# Patient Record
Sex: Female | Born: 1970 | Race: Black or African American | Hispanic: No | Smoking: Never smoker
Health system: Southern US, Community
[De-identification: ages and names within clinical notes are randomized; demographics above are authoritative.]

## PROBLEM LIST (undated history)

## (undated) DIAGNOSIS — E669 Obesity, unspecified: Secondary | ICD-10-CM

## (undated) DIAGNOSIS — G459 Transient cerebral ischemic attack, unspecified: Secondary | ICD-10-CM

## (undated) DIAGNOSIS — E039 Hypothyroidism, unspecified: Secondary | ICD-10-CM

## (undated) DIAGNOSIS — D649 Anemia, unspecified: Secondary | ICD-10-CM

## (undated) DIAGNOSIS — R7303 Prediabetes: Secondary | ICD-10-CM

## (undated) DIAGNOSIS — T148XXA Other injury of unspecified body region, initial encounter: Secondary | ICD-10-CM

## (undated) DIAGNOSIS — M1990 Unspecified osteoarthritis, unspecified site: Secondary | ICD-10-CM

## (undated) DIAGNOSIS — J45909 Unspecified asthma, uncomplicated: Secondary | ICD-10-CM

## (undated) DIAGNOSIS — G4733 Obstructive sleep apnea (adult) (pediatric): Secondary | ICD-10-CM

## (undated) DIAGNOSIS — Z9989 Dependence on other enabling machines and devices: Secondary | ICD-10-CM

## (undated) DIAGNOSIS — J189 Pneumonia, unspecified organism: Secondary | ICD-10-CM

## (undated) DIAGNOSIS — G43909 Migraine, unspecified, not intractable, without status migrainosus: Secondary | ICD-10-CM

## (undated) DIAGNOSIS — J42 Unspecified chronic bronchitis: Secondary | ICD-10-CM

## (undated) DIAGNOSIS — N3281 Overactive bladder: Secondary | ICD-10-CM

## (undated) HISTORY — DX: Hypothyroidism, unspecified: E03.9

---

## 2008-11-05 DEATH — deceased

## 2010-10-04 ENCOUNTER — Ambulatory Visit
Admission: RE | Admit: 2010-10-04 | Discharge: 2010-10-04 | Disposition: A | Discharge status: Home / Self Care | Payer: Medicaid Other | Source: Ambulatory Visit | Attending: Cardiovascular Disease | Admitting: Cardiovascular Disease

## 2010-10-04 ENCOUNTER — Other Ambulatory Visit: Payer: Self-pay | Admitting: Cardiovascular Disease

## 2010-10-04 DIAGNOSIS — W19XXXA Unspecified fall, initial encounter: Secondary | ICD-10-CM

## 2010-10-11 ENCOUNTER — Emergency Department (HOSPITAL_COMMUNITY): Payer: Medicaid Other

## 2010-10-11 ENCOUNTER — Other Ambulatory Visit (HOSPITAL_COMMUNITY): Payer: Medicaid Other

## 2010-10-11 ENCOUNTER — Emergency Department (HOSPITAL_COMMUNITY)
Admission: EM | Admit: 2010-10-11 | Discharge: 2010-10-11 | Disposition: A | Discharge status: Home / Self Care | Payer: Medicaid Other | Attending: Emergency Medicine | Admitting: Emergency Medicine

## 2010-10-11 DIAGNOSIS — I369 Nonrheumatic tricuspid valve disorder, unspecified: Secondary | ICD-10-CM

## 2010-10-11 DIAGNOSIS — R209 Unspecified disturbances of skin sensation: Secondary | ICD-10-CM | POA: Insufficient documentation

## 2010-10-11 DIAGNOSIS — R51 Headache: Secondary | ICD-10-CM | POA: Insufficient documentation

## 2010-10-11 DIAGNOSIS — R531 Weakness: Secondary | ICD-10-CM

## 2010-10-11 DIAGNOSIS — Z79899 Other long term (current) drug therapy: Secondary | ICD-10-CM | POA: Insufficient documentation

## 2010-10-11 DIAGNOSIS — I499 Cardiac arrhythmia, unspecified: Secondary | ICD-10-CM | POA: Insufficient documentation

## 2010-10-11 DIAGNOSIS — R200 Anesthesia of skin: Secondary | ICD-10-CM

## 2010-10-11 DIAGNOSIS — R079 Chest pain, unspecified: Secondary | ICD-10-CM | POA: Insufficient documentation

## 2010-10-11 LAB — POCT I-STAT, CHEM 8
Calcium, Ion: 1.21 mmol/L (ref 1.12–1.32)
Chloride: 102 mEq/L (ref 96–112)
Glucose, Bld: 132 mg/dL — ABNORMAL HIGH (ref 70–99)
HCT: 33 % — ABNORMAL LOW (ref 36.0–46.0)
TCO2: 28 mmol/L (ref 0–100)

## 2010-10-11 LAB — RAPID URINE DRUG SCREEN, HOSP PERFORMED
Amphetamines: NOT DETECTED
Benzodiazepines: NOT DETECTED
Cocaine: NOT DETECTED
Opiates: NOT DETECTED
Tetrahydrocannabinol: NOT DETECTED

## 2010-10-11 LAB — POCT PREGNANCY, URINE: Preg Test, Ur: NEGATIVE

## 2010-10-11 LAB — URINALYSIS, ROUTINE W REFLEX MICROSCOPIC
Bilirubin Urine: NEGATIVE
Ketones, ur: NEGATIVE mg/dL
Nitrite: NEGATIVE
Urobilinogen, UA: 0.2 mg/dL (ref 0.0–1.0)

## 2010-10-11 LAB — CBC
HCT: 33.6 % — ABNORMAL LOW (ref 36.0–46.0)
Hemoglobin: 10.7 g/dL — ABNORMAL LOW (ref 12.0–15.0)
WBC: 11.1 10*3/uL — ABNORMAL HIGH (ref 4.0–10.5)

## 2010-10-11 LAB — POCT CARDIAC MARKERS: Troponin i, poc: <0.05 ng/mL (ref 0.00–0.09)

## 2010-10-11 LAB — URINE MICROSCOPIC-ADD ON

## 2010-10-13 ENCOUNTER — Ambulatory Visit (HOSPITAL_COMMUNITY): Admission: RE | Admit: 2010-10-13 | Payer: Medicaid Other | Source: Ambulatory Visit

## 2010-10-13 ENCOUNTER — Ambulatory Visit: Admission: RE | Admit: 2010-10-13 | Payer: Medicaid Other | Source: Ambulatory Visit

## 2010-10-13 ENCOUNTER — Ambulatory Visit: Payer: Medicaid Other

## 2010-10-13 ENCOUNTER — Other Ambulatory Visit (HOSPITAL_COMMUNITY): Payer: Medicaid Other

## 2010-10-13 ENCOUNTER — Ambulatory Visit (HOSPITAL_COMMUNITY): Payer: Medicaid Other

## 2010-11-07 LAB — HM PAP SMEAR

## 2010-11-09 ENCOUNTER — Other Ambulatory Visit: Payer: Self-pay | Admitting: Obstetrics & Gynecology

## 2010-11-09 DIAGNOSIS — Z1231 Encounter for screening mammogram for malignant neoplasm of breast: Secondary | ICD-10-CM

## 2010-11-13 LAB — HM MAMMOGRAPHY

## 2010-11-15 ENCOUNTER — Ambulatory Visit (HOSPITAL_COMMUNITY)
Admission: RE | Admit: 2010-11-15 | Discharge: 2010-11-15 | Disposition: A | Discharge status: Home / Self Care | Payer: Medicaid Other | Source: Ambulatory Visit | Attending: Obstetrics & Gynecology | Admitting: Obstetrics & Gynecology

## 2010-11-15 DIAGNOSIS — Z1231 Encounter for screening mammogram for malignant neoplasm of breast: Secondary | ICD-10-CM

## 2010-11-22 ENCOUNTER — Encounter: Payer: Self-pay | Admitting: Endocrinology

## 2010-11-22 ENCOUNTER — Ambulatory Visit (INDEPENDENT_AMBULATORY_CARE_PROVIDER_SITE_OTHER): Payer: Medicaid Other | Admitting: Endocrinology

## 2010-11-22 DIAGNOSIS — R7309 Other abnormal glucose: Secondary | ICD-10-CM

## 2010-11-22 DIAGNOSIS — R739 Hyperglycemia, unspecified: Secondary | ICD-10-CM

## 2010-11-22 MED ORDER — METFORMIN HCL ER 500 MG PO TB24: 500.0000 mg | ORAL_TABLET | Freq: Every day | ORAL | Status: DC

## 2010-11-22 NOTE — Progress Notes (Signed)
Subjective:    Patient ID: Peggy Avery, female    DOB: 17-Aug-1970, 40 y.o.   MRN: [id number]  HPI Pt was noted to have hyperglycemia on labs.  She is G1 P1.  She says she is considering another pregnancy soon, and that it is possible now.  However, she would prefer to wait another 6 mos or so.  Symptomatically, she has many years of moderate pelvic cramps during menstruation, but no assoc decreased menstrual frequency.   Past Medical History  Diagnosis Date  . Hypothyroidism     No past surgical history on file.  History   Social History  . Marital Status: Single    Spouse Name: N/A    Number of Children: N/A  . Years of Education: N/A   Occupational History  . Not on file.   Social History Main Topics  . Smoking status: Never Smoker   . Smokeless tobacco: Not on file  . Alcohol Use: No  . Drug Use: Yes  . Sexually Active: Not on file   Other Topics Concern  . Not on file   Social History Narrative  . 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    No current outpatient prescriptions on file prior to visit.    Allergies  Allergen Reactions  . Ibuprofen   . Penicillins     Family History  Problem Relation Age of Onset  . Cancer Mother     Ovarian Cancer  . Cancer Father     Prostate Cancer  . Heart disease Other     Grandparent  . Stroke Other   . Hypertension Other   . Diabetes Other     Parent    BP 114/70  Pulse 96  Temp(Src) 98.6 F (37 C) (Oral)  Ht 5\' 4"  (1.626 m)  Wt 329 lb (149.233 kg)  BMI 56.47 kg/m2  SpO2 99%  LMP 10/10/2010     Review of Systems denies blurry vision, headache, chest pain, sob, n/v, urinary frequency, cramps, excessive diaphoresis, memory loss, depression, hypoglycemia, and rhinorrhea, and easy bruising.  She has lost 30 lbs, due to her efforts.      Objective:   Physical Exam VS: see vs page GEN: no distress.  Morbid obesity.   HEAD: head: no deformity eyes: no periorbital swelling, no proptosis external  nose and ears are normal mouth: no lesion seen Face:  Mild hirsutism NECK: supple, thyroid is not enlarged CHEST WALL: no deformity CV: reg rate and rhythm, no murmur ABD: abdomen is soft, nontender.  no hepatosplenomegaly.  not distended.  no hernia MUSCULOSKELETAL: muscle bulk and strength are grossly normal.  no obvious joint swelling.  gait is normal and steady EXTEMITIES: no deformity.  no ulcer on the feet.  feet are of normal color and temp.  There is trace bilat leg edema PULSES: dorsalis pedis intact bilat.  no carotid bruit. NEURO:  cn 2-12 grossly intact.   readily moves all 4's.  sensation is intact to touch on the feet, except decreased on the right foot (pt states old injury) SKIN:  Normal texture and temperature.  No rash or suspicious lesion is visible.   NODES:  None palpable at the neck PSYCH: alert, oriented x3.  Does not appear anxious nor depressed.     A1c=5.7   Assessment & Plan:  Hyperglycemia.  She has an approx 10% annual risk of developing dm. Morbid obesity.  She is at risk for other health probs. Dysmenorrhea.  The metformin may  make these better or worse.

## 2010-11-22 NOTE — Patient Instructions (Addendum)
good diet and exercise habits significanly improve the control of your blood sugar.  please let me know if you wish to be referred to a dietician, or for weight-loss surgery.  high blood sugar is very risky to your health.   i have sent a prescription to your pharmacy. Please note that this medication will increase your chances of getting pregnant.   Please make a follow-up appointment in 3 months.

## 2010-11-24 DIAGNOSIS — R739 Hyperglycemia, unspecified: Secondary | ICD-10-CM | POA: Insufficient documentation

## 2010-11-26 ENCOUNTER — Telehealth: Payer: Self-pay

## 2010-11-26 NOTE — Telephone Encounter (Signed)
Message copied by Margaret Pyle on Mon Nov 26, 2010  2:30 PM ------      Message from: Romero Belling      Created: Sat Nov 24, 2010  5:40 PM       Please fax note to pcp.  Thank you.

## 2010-11-26 NOTE — Telephone Encounter (Signed)
done

## 2010-11-30 ENCOUNTER — Encounter: Payer: Self-pay | Admitting: Endocrinology

## 2011-02-28 ENCOUNTER — Ambulatory Visit: Payer: Medicaid Other | Admitting: Endocrinology

## 2011-03-21 ENCOUNTER — Ambulatory Visit: Payer: Medicaid Other | Admitting: Endocrinology

## 2011-03-21 DIAGNOSIS — Z0289 Encounter for other administrative examinations: Secondary | ICD-10-CM

## 2011-04-04 ENCOUNTER — Other Ambulatory Visit (HOSPITAL_COMMUNITY): Payer: Self-pay | Admitting: Cardiovascular Disease

## 2011-04-11 ENCOUNTER — Encounter (HOSPITAL_COMMUNITY)
Admission: RE | Admit: 2011-04-11 | Discharge: 2011-04-11 | Disposition: A | Discharge status: Home / Self Care | Payer: Medicaid Other | Source: Ambulatory Visit | Attending: Cardiovascular Disease | Admitting: Cardiovascular Disease

## 2011-04-11 ENCOUNTER — Ambulatory Visit (HOSPITAL_COMMUNITY): Payer: Medicaid Other

## 2011-04-11 DIAGNOSIS — R079 Chest pain, unspecified: Secondary | ICD-10-CM | POA: Insufficient documentation

## 2011-04-12 ENCOUNTER — Ambulatory Visit (HOSPITAL_COMMUNITY)
Admission: RE | Admit: 2011-04-12 | Discharge: 2011-04-12 | Disposition: A | Discharge status: Home / Self Care | Payer: Medicaid Other | Source: Ambulatory Visit | Attending: Cardiovascular Disease | Admitting: Cardiovascular Disease

## 2011-04-12 DIAGNOSIS — R002 Palpitations: Secondary | ICD-10-CM | POA: Insufficient documentation

## 2011-04-12 DIAGNOSIS — E669 Obesity, unspecified: Secondary | ICD-10-CM | POA: Insufficient documentation

## 2011-04-12 MED ORDER — TECHNETIUM TC 99M TETROFOSMIN IV KIT
30.0000 | PACK | Freq: Once | INTRAVENOUS | Status: AC | PRN
  Administered 2011-04-12: 30 via INTRAVENOUS

## 2011-04-12 MED ORDER — TECHNETIUM TC 99M TETROFOSMIN IV KIT
30.0000 | PACK | Freq: Once | INTRAVENOUS | Status: AC | PRN
  Administered 2011-04-11: 30 via INTRAVENOUS

## 2011-05-09 ENCOUNTER — Ambulatory Visit
Admission: RE | Admit: 2011-05-09 | Discharge: 2011-05-09 | Disposition: A | Discharge status: Home / Self Care | Payer: Medicaid Other | Source: Ambulatory Visit | Attending: Cardiovascular Disease | Admitting: Cardiovascular Disease

## 2011-05-09 ENCOUNTER — Other Ambulatory Visit: Payer: Self-pay | Admitting: Cardiovascular Disease

## 2011-05-09 DIAGNOSIS — M79672 Pain in left foot: Secondary | ICD-10-CM

## 2011-05-09 DIAGNOSIS — M549 Dorsalgia, unspecified: Secondary | ICD-10-CM

## 2011-09-04 ENCOUNTER — Ambulatory Visit: Payer: Medicaid Other | Admitting: Physical Therapy

## 2011-09-28 ENCOUNTER — Emergency Department (HOSPITAL_COMMUNITY)
Admission: EM | Admit: 2011-09-28 | Discharge: 2011-09-28 | Disposition: A | Discharge status: Home / Self Care | Payer: Medicaid Other | Attending: Emergency Medicine | Admitting: Emergency Medicine

## 2011-09-28 ENCOUNTER — Emergency Department (HOSPITAL_COMMUNITY): Payer: Medicaid Other

## 2011-09-28 ENCOUNTER — Encounter (HOSPITAL_COMMUNITY): Payer: Self-pay | Admitting: Emergency Medicine

## 2011-09-28 ENCOUNTER — Other Ambulatory Visit: Payer: Self-pay

## 2011-09-28 DIAGNOSIS — R079 Chest pain, unspecified: Secondary | ICD-10-CM | POA: Insufficient documentation

## 2011-09-28 HISTORY — DX: Overactive bladder: N32.81

## 2011-09-28 HISTORY — DX: Obesity, unspecified: E66.9

## 2011-09-28 LAB — CBC
HCT: 34.1 % — ABNORMAL LOW (ref 36.0–46.0)
MCH: 28.8 pg (ref 26.0–34.0)
MCHC: 33.7 g/dL (ref 30.0–36.0)
RDW: 13.5 % (ref 11.5–15.5)

## 2011-09-28 LAB — BASIC METABOLIC PANEL
Calcium: 9.3 mg/dL (ref 8.4–10.5)
Chloride: 100 mEq/L (ref 96–112)
Creatinine, Ser: 0.85 mg/dL (ref 0.50–1.10)
GFR calc Af Amer: >90 mL/min (ref >90)
GFR calc non Af Amer: 83 mL/min — ABNORMAL LOW (ref >90)

## 2011-09-28 LAB — DIFFERENTIAL
Basophils Absolute: 0 10*3/uL (ref 0.0–0.1)
Basophils Relative: 0 % (ref 0–1)
Eosinophils Absolute: 0.1 10*3/uL (ref 0.0–0.7)
Eosinophils Relative: 1 % (ref 0–5)
Monocytes Absolute: 0.5 10*3/uL (ref 0.1–1.0)
Neutro Abs: 6.6 10*3/uL (ref 1.7–7.7)

## 2011-09-28 LAB — POCT I-STAT TROPONIN I: Troponin i, poc: 0 ng/mL (ref 0.00–0.08)

## 2011-09-28 MED ORDER — NITROGLYCERIN 0.4 MG SL SUBL
0.4000 mg | SUBLINGUAL_TABLET | SUBLINGUAL | Status: DC | PRN
  Administered 2011-09-28: 0.4 mg via SUBLINGUAL
  Filled 2011-09-28: qty 25

## 2011-09-28 MED ORDER — ASPIRIN 81 MG PO CHEW
324.0000 mg | CHEWABLE_TABLET | Freq: Once | ORAL | Status: AC
  Administered 2011-09-28: 324 mg via ORAL
  Filled 2011-09-28: qty 4

## 2011-09-28 NOTE — ED Notes (Signed)
PT. REPORTS LEFT CHEST PAIN AND LEFT ARM NUMBNESS ONSET THIS EVENING , DENIES SOB , NAUSEA OR DIAPHORESIS.

## 2011-09-28 NOTE — Discharge Instructions (Signed)
Your caregiver has diagnosed you as having chest pain that is nonspecific for one problem. This means that after looking at you and examining you and ordering tests (such as blood work, chest x-rays and EKG), your caregiver does not believe that the problem is serious enough to need watching in the hospital. This judgment is often made after testing shows no acute heart attack and you are at low risk for sudden acute heart condition. Chest pain comes from many different causes.  Seek immediate medical attention if:   You have severe chest pain, especially if the pain is crushing or pressure-like and spreads to the arms, back, neck, or jaw, or if you have sweating, nausea, shortness of breath. This is an emergency. Don't wait to see if the pain will go away. Get medical help at once. Call 911 immediately. Do not drive herself to the hospital.   Your chest pain gets worse and does not go away with rest.   You have an attack of chest pain lasting longer than usual, despite rest and treatment with the medications your caregiver has prescribed   You awaken from sleep with chest pain or shortness of breath.   You feel faint or dizzy   You have chest pain not typical of your usual pain for which you originally saw your caregiver.  You must have a repeat evaluation within 24 hours for a recheck of your heart.  Please call your doctor this morning to schedule this appointment. If you do not have a family doctor, please see the list of doctors below.  RESOURCE GUIDE  Dental Problems  Patients with Medicaid: [facility]                     [facility] [address]                                           [address] Phone:  [phone]                                                  Phone:  [phone]  If unable to pay or uninsured, contact:  [facility] or [facility] to become qualified for the adult dental clinic.  Chronic Pain  Problems Contact [facility]  [phone] Patients need to be referred by their primary care doctor.  Insufficient Money for Medicine Contact [employer]:  call "211" or [facility] 271-5999.  No Primary Care Doctor Call [facility]  832-8000 Other agencies that provide inexpensive medical care    [facility]  832-8035    [facility]  832-7272    [facility]  [phone]    [facility] Clinic  832-4777    [facility]  [phone]    [facility]  [phone]  Psychological Services [facility]  [phone] [facility]  [phone] [facility]   800 853-5163 (emergency services 641-4993)  Substance Abuse Resources [facility]  336-882-2125 [facility] 336-784-9470 [facility] 336-285-9073 [facility] 336-845-3988 Residential & Outpatient Substance Abuse Program  800-659-3381  Abuse/Neglect [place] Child Abuse Hotline (336) 641-3795 [place] [facility] Abuse Hotline 800-378-5315 (After Hours)  Emergency Shelter  [facility] (336) 271-5985  [facility] (336) 275-9566 [facility] (704) 372-4663  MRSA Hotline #:   832-7006    [place] Resources  [facility] [place]     [employer]                          [facility] [address] [place]                       [address]      [address]  [place]                                                [place]                            [place] Phone:  [phone]                                   Phone:  [phone]                 Phone:  [phone]  [facility] Phone:  [phone]  [place] Child Abuse Hotline (336) 342-1394 (336) 342-3537 (After Hours)    

## 2011-09-28 NOTE — ED Provider Notes (Signed)
History     CSN: [id number]  Arrival date & time 09/28/11  4098   First MD Initiated Contact with Patient 09/28/11 [phone]      Chief Complaint  Patient presents with  . Chest Pain    (Consider location/radiation/quality/duration/timing/severity/associated sxs/prior treatment) HPI Comments: Patient with a history of obesity and diabetes presents emergency department with a chief complaint of chest pain.  Onset was at 1 a.m., location was left side chest, radiation to left shoulder & duration 2-5 minutes and intermittent in nature.  Patient describes the pain as a heavy squeezing sensation. She is unaware of exacerbating factors including exertion.  Associated symptoms include shortness of breath and dyspnea on exertion that occurred recently in the last hour.  Patient also verbalizes that she is under increased emotional stress.  In addition patient reports that her left arm feels heavy.  She denies any swelling of her lower extremities, PND, orthopnea, cough, hemoptysis, abdominal pain, nausea, vomiting, or diarrhea. Patient does see a cardiologist,  Dr. Algie Coffer She states she has had a dobutamine stress test and echocardiogram before, however she did not recall the results of his tests and they are not found in her medical records.  Patient states that her doctor told her "I have a weak spot in my heart".  Patient has a family history significant for early death associated MI, her grandfather at age 24.  She also reports that her younger sister had a stroke last year at age 56.  The history is provided by the patient.    Past Medical History  Diagnosis Date  . Hypothyroidism   . Obesity   . Overactive bladder     History reviewed. No pertinent past surgical history.  Family History  Problem Relation Age of Onset  . Cancer Mother     Ovarian Cancer  . Cancer Father     Prostate Cancer  . Heart disease Other     Grandparent  . Stroke Other   . Hypertension Other   . Diabetes Other      Parent    History  Substance Use Topics  . Smoking status: Never Smoker   . Smokeless tobacco: Not on file  . Alcohol Use: No    OB History    Grav Para Term Preterm Abortions TAB SAB Ect Mult Living                  Review of Systems  All other systems reviewed and are negative.    Allergies  Ibuprofen and Penicillins  Home Medications   Current Outpatient Rx  Name Route Sig Dispense Refill  . METFORMIN HCL ER 500 MG PO TB24 Oral Take 1 tablet (500 mg total) by mouth daily with breakfast. 60 tablet 11    BP 100/42  Pulse 77  Temp(Src) 98.5 F (36.9 C) (Oral)  Resp 17  SpO2 99%  LMP 09/06/2011  Physical Exam  Nursing note and vitals reviewed. Constitutional: She appears well-developed and well-nourished. No distress.       Morbidly obese  HENT:  Head: Normocephalic and atraumatic.  Eyes: Conjunctivae and EOM are normal. Pupils are equal, round, and reactive to light.  Neck: Normal range of motion. Neck supple. Normal carotid pulses and no JVD present. Carotid bruit is not present. No rigidity. Normal range of motion present.  Cardiovascular: Normal rate, regular rhythm, S1 normal, S2 normal, normal heart sounds, intact distal pulses and normal pulses.  Exam reveals no gallop and no friction rub.  No murmur heard.      No pitting edema bilaterally, RRR, difficult to auscultate d/t pt obesity- murmur present in AP region, distal pulses intact, no carotid bruit or JVD.   Pulmonary/Chest: Effort normal and breath sounds normal. No accessory muscle usage or stridor. No respiratory distress. She exhibits no tenderness and no bony tenderness.  Abdominal: Bowel sounds are normal.       Soft non tender. Non pulsatile aorta.   Skin: Skin is warm, dry and intact. No rash noted. She is not diaphoretic. No cyanosis. Nails show no clubbing.    ED Course  Procedures (including critical care time)  Labs Reviewed  CBC - Abnormal; Notable for the following:    WBC  11.5 (*)    Hemoglobin 11.5 (*)    HCT 34.1 (*)    All other components within normal limits  DIFFERENTIAL - Abnormal; Notable for the following:    Lymphs Abs 4.3 (*)    All other components within normal limits  BASIC METABOLIC PANEL - Abnormal; Notable for the following:    Glucose, Bld 100 (*)    GFR calc non Af Amer 83 (*)    All other components within normal limits  POCT I-STAT TROPONIN I   Dg Chest 2 View  09/28/2011  *RADIOLOGY REPORT*  Clinical Data: Chest pain, shortness of breath, bilateral shoulder pain, recent pneumonia  CHEST - 2 VIEW  Comparison: None  Findings: Upper-normal size of cardiac silhouette. Mediastinal contours and pulmonary vascularity normal. Lungs clear. No pleural effusion or pneumothorax. Bones unremarkable.  IMPRESSION: No acute abnormalities.  Original Report Authenticated By: Lollie Marrow, M.D.    Date: 09/28/2011  Rate: 82  Rhythm: normal sinus rhythm  QRS Axis: left  Intervals: normal  ST/T Wave abnormalities: normal  Conduction Disutrbances:none  Narrative Interpretation:   Old EKG Reviewed: unchanged    No diagnosis found.    MDM  Chest Pain  Pt case discussed with Dr. Hyacinth Meeker. Plan is to consult Dr. Roseanne Kaufman on call physician to likely admit for angina. Pt concerning for ACS etiology d/t commodities including obesity & DM, fam hx of early MI, and presentation of waxing/waning pain that has not resolved.  CXR- no acute abnormalities, Trop neg x1         Jaci Carrel, PA-C 09/28/11 [phone]

## 2011-09-28 NOTE — ED Notes (Signed)
Patient reports being treated recently for pneumonia and tonight started having episodic right sided chest pain. States comes and goes and is currently 3/10 but has gotten up to a 10/10 reports persistent cough

## 2011-09-28 NOTE — ED Notes (Signed)
41 year old female with a history of morbid obesity and family history of heart disease who states that she takes diabetes medications at the request of her doctor to help prevent diabetes. She presents to the hospital with chest pain which is left-sided, radiation to the left shoulder, some shortness of breath which has resolved. The chest pain was short lived, lasted several minutes, has come back one time. She denies any swelling of her legs, trauma, travel, radiation of the pain to her back. She denies fevers, chills, cough, rash, abdominal pain. According to the medical record she had a nuclear medicine stress test which was performed in October of 2012 that showed no signs of myocardial ischemia. There was a slightly lowered ejection fraction and some wall motion hypokinesis.  Physical exam:  Lungs clear, abdomen soft, heart without murmurs rubs or gallops, no tachycardia, strong peripheral pulses, no peripheral edema.  : Assessment: EKG unchanged showing left axis deviation without ischemia, troponin is negative, lab work unremarkable. I have discussed the results with the patient and with the consultant Dr. Sharyn Lull who does not believe that the patient needs to be in the hospital.  I have discussed results with the patient including the recent thorough cardiac workup, have recommended followup in the office or return should her symptoms worsen. She has agreed and will followup.  Medical screening examination/treatment/procedure(s) were conducted as a shared visit with non-physician practitioner(s) and myself.  I personally evaluated the patient during the encounter   Vida Roller, MD 09/28/11 [phone]

## 2011-09-29 NOTE — ED Provider Notes (Signed)
Medical screening examination/treatment/procedure(s) were conducted as a shared visit with non-physician practitioner(s) and myself.  I personally evaluated the patient during the encounter  Please see my separate respective documentation pertaining to this patient encounter   Vida Roller, MD 09/29/11 [phone]

## 2011-11-13 ENCOUNTER — Other Ambulatory Visit: Payer: Self-pay | Admitting: Nurse Practitioner

## 2011-11-13 DIAGNOSIS — Z1231 Encounter for screening mammogram for malignant neoplasm of breast: Secondary | ICD-10-CM

## 2011-11-27 ENCOUNTER — Other Ambulatory Visit: Payer: Self-pay | Admitting: Obstetrics & Gynecology

## 2011-11-27 DIAGNOSIS — N644 Mastodynia: Secondary | ICD-10-CM

## 2011-12-05 ENCOUNTER — Ambulatory Visit
Admission: RE | Admit: 2011-12-05 | Discharge: 2011-12-05 | Disposition: A | Discharge status: Home / Self Care | Payer: Medicaid Other | Source: Ambulatory Visit | Attending: Obstetrics & Gynecology | Admitting: Obstetrics & Gynecology

## 2011-12-05 DIAGNOSIS — N644 Mastodynia: Secondary | ICD-10-CM

## 2011-12-09 ENCOUNTER — Ambulatory Visit (HOSPITAL_COMMUNITY): Admission: RE | Admit: 2011-12-09 | Payer: Medicaid Other | Source: Ambulatory Visit

## 2011-12-11 ENCOUNTER — Ambulatory Visit: Payer: Medicaid Other | Attending: Diagnostic Neuroimaging | Admitting: Physical Therapy

## 2011-12-11 DIAGNOSIS — M6281 Muscle weakness (generalized): Secondary | ICD-10-CM | POA: Insufficient documentation

## 2011-12-11 DIAGNOSIS — IMO0001 Reserved for inherently not codable concepts without codable children: Secondary | ICD-10-CM | POA: Insufficient documentation

## 2011-12-11 DIAGNOSIS — R269 Unspecified abnormalities of gait and mobility: Secondary | ICD-10-CM | POA: Insufficient documentation

## 2011-12-24 ENCOUNTER — Encounter: Payer: Medicaid Other | Admitting: Physical Therapy

## 2011-12-26 ENCOUNTER — Encounter: Payer: Medicaid Other | Admitting: Physical Therapy

## 2012-01-06 ENCOUNTER — Ambulatory Visit: Payer: Medicaid Other | Attending: Diagnostic Neuroimaging | Admitting: Physical Therapy

## 2012-01-06 DIAGNOSIS — IMO0001 Reserved for inherently not codable concepts without codable children: Secondary | ICD-10-CM | POA: Insufficient documentation

## 2012-01-06 DIAGNOSIS — M6281 Muscle weakness (generalized): Secondary | ICD-10-CM | POA: Insufficient documentation

## 2012-01-06 DIAGNOSIS — R269 Unspecified abnormalities of gait and mobility: Secondary | ICD-10-CM | POA: Insufficient documentation

## 2012-01-22 ENCOUNTER — Encounter: Payer: Medicaid Other | Admitting: Physical Therapy

## 2012-01-27 ENCOUNTER — Encounter: Payer: Medicaid Other | Admitting: Physical Therapy

## 2012-02-04 ENCOUNTER — Encounter: Payer: Medicaid Other | Admitting: Rehabilitative and Restorative Service Providers"

## 2012-03-24 ENCOUNTER — Ambulatory Visit
Admission: RE | Admit: 2012-03-24 | Discharge: 2012-03-24 | Disposition: A | Discharge status: Home / Self Care | Payer: Medicaid Other | Source: Ambulatory Visit | Attending: Cardiovascular Disease | Admitting: Cardiovascular Disease

## 2012-03-24 ENCOUNTER — Other Ambulatory Visit: Payer: Self-pay | Admitting: Cardiovascular Disease

## 2012-03-24 DIAGNOSIS — M542 Cervicalgia: Secondary | ICD-10-CM

## 2012-03-24 DIAGNOSIS — R1310 Dysphagia, unspecified: Secondary | ICD-10-CM

## 2012-03-31 IMAGING — CR DG CHEST 2V
2 series · 2 of 2 positions shown · non-contrast
Comparison: None

CLINICAL DATA: Chest pain, shortness of breath, bilateral shoulder
pain, recent pneumonia

CHEST - 2 VIEW

[w chest pa]
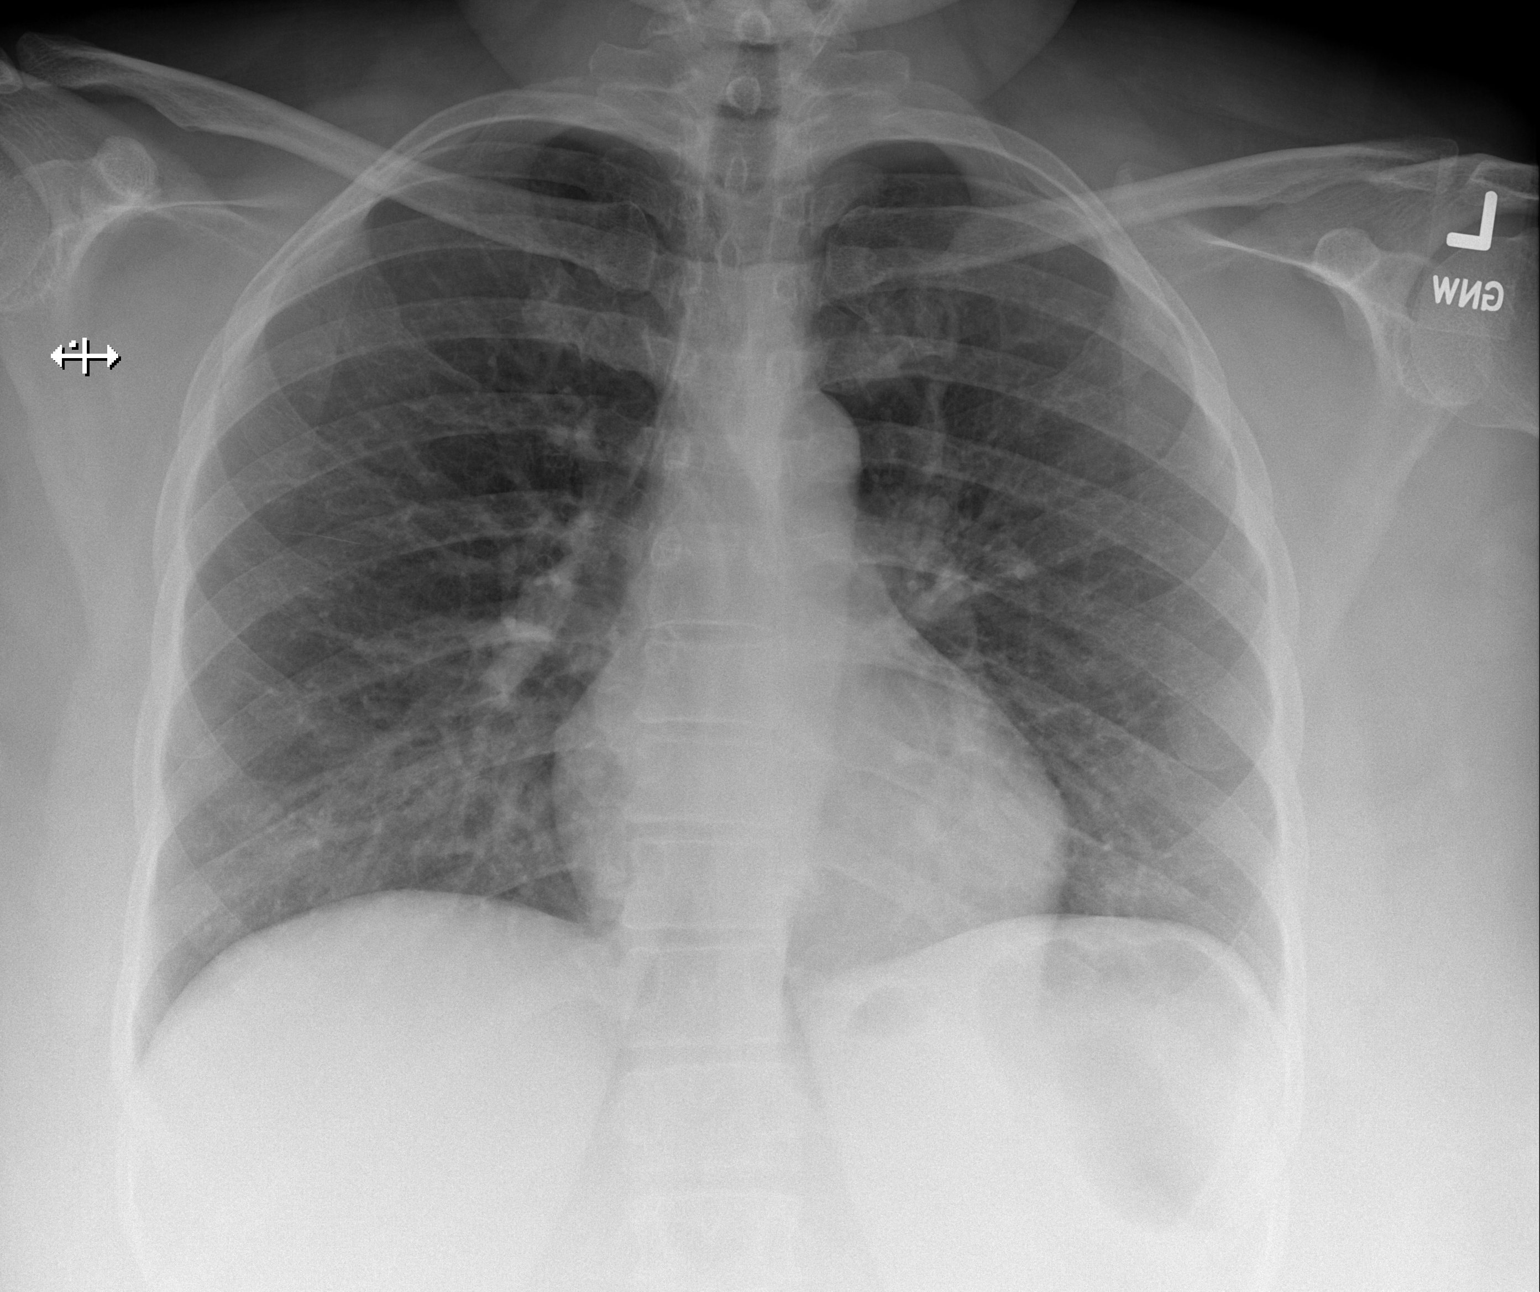

[w chest lat]
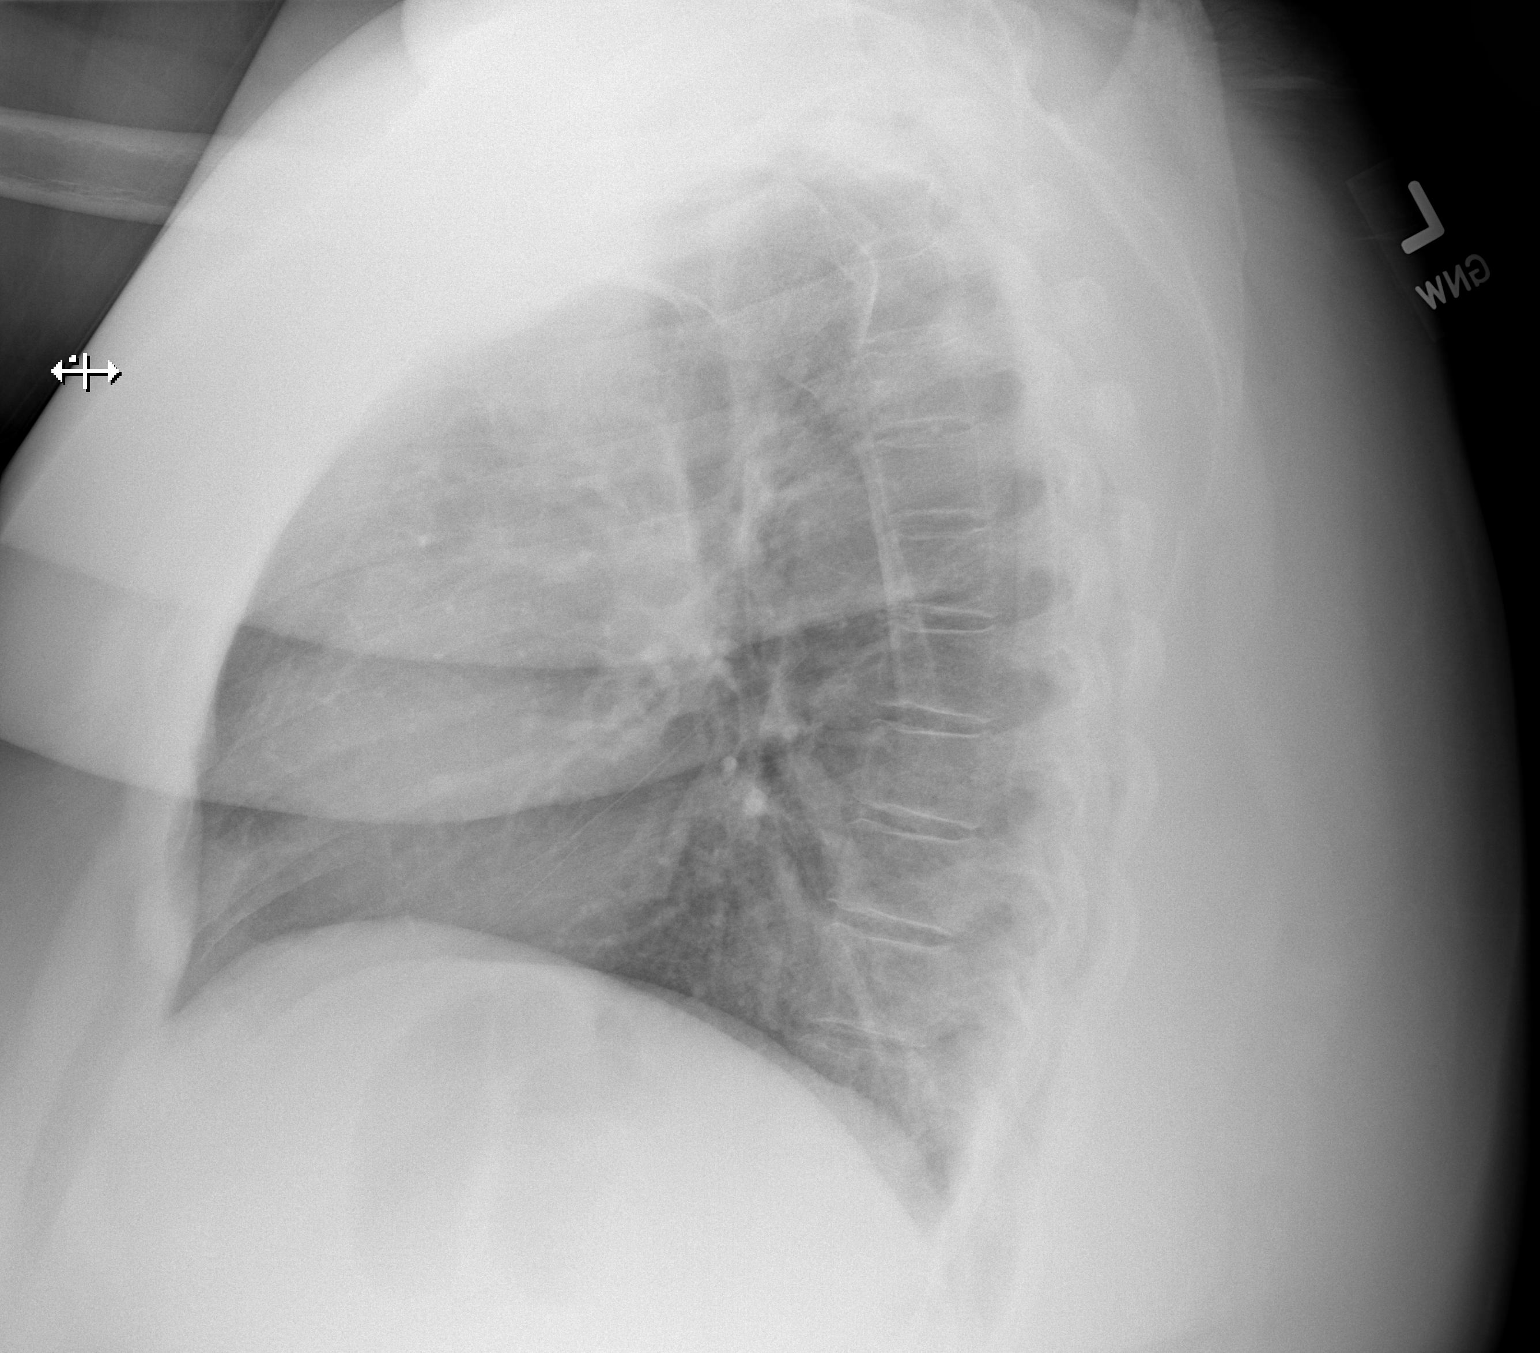

[2 of 2 positions shown; findings below may reference images not displayed]

FINDINGS: Upper-normal size of cardiac silhouette.
Mediastinal contours and pulmonary vascularity normal.
Lungs clear.
No pleural effusion or pneumothorax.
Bones unremarkable.
IMPRESSION: No acute abnormalities.

## 2012-07-09 ENCOUNTER — Ambulatory Visit
Admission: RE | Admit: 2012-07-09 | Discharge: 2012-07-09 | Disposition: A | Discharge status: Home / Self Care | Payer: Medicaid Other | Source: Ambulatory Visit | Attending: Cardiovascular Disease | Admitting: Cardiovascular Disease

## 2012-07-09 ENCOUNTER — Other Ambulatory Visit: Payer: Self-pay | Admitting: Cardiovascular Disease

## 2012-07-09 DIAGNOSIS — M25562 Pain in left knee: Secondary | ICD-10-CM

## 2012-07-09 DIAGNOSIS — M25561 Pain in right knee: Secondary | ICD-10-CM

## 2012-10-14 ENCOUNTER — Ambulatory Visit (INDEPENDENT_AMBULATORY_CARE_PROVIDER_SITE_OTHER): Payer: Medicaid Other | Admitting: Obstetrics & Gynecology

## 2012-10-14 ENCOUNTER — Encounter: Payer: Self-pay | Admitting: Obstetrics & Gynecology

## 2012-10-14 VITALS — BP 129/84 | HR 83 | Temp 98.1°F | Ht 64.0 in | Wt 338.0 lb

## 2012-10-14 DIAGNOSIS — Z3200 Encounter for pregnancy test, result unknown: Secondary | ICD-10-CM

## 2012-10-14 DIAGNOSIS — F529 Unspecified sexual dysfunction not due to a substance or known physiological condition: Secondary | ICD-10-CM

## 2012-10-14 DIAGNOSIS — D259 Leiomyoma of uterus, unspecified: Secondary | ICD-10-CM | POA: Insufficient documentation

## 2012-10-14 NOTE — Progress Notes (Signed)
Subjective:     Peggy Avery is a 42 y.o. female here for a routine exam.  Current complaints: pt was in the office in January 2014. Pt states she was having heavy painful menstrual cycles. Pt states she had a light menstrual cycle last month. Pt states it only lasted three days. Pt states she is currently 9 days late. Pt states her cycle are usually heavy lasting 9 days and very painful.  Personal health questionnaire reviewed: no.   Gynecologic History Patient's last menstrual period was 09/05/2012. Contraception: none  Obstetric History OB History   Grav Para Term Preterm Abortions TAB SAB Ect Mult Living                   The following portions of the patient's history were reviewed and updated as appropriate: allergies, current medications, past family history, past medical history, past social history, past surgical history and problem list.  Review of Systems Pertinent items are noted in HPI.    Objective:     Not performed     Assessment:    Small fibroid uterus, minimal symptoms ?Cardiac history; considering attempting to conceive--morbid obesity, AMA Sexual dysfunction--anorgasmic; able to achieve orgasm with masturbation, and in a prior relationship--situational   Plan:    Return prn Referrals -->[facility] preconception counseling, sex therapist

## 2012-11-03 ENCOUNTER — Institutional Professional Consult (permissible substitution): Payer: Medicaid Other

## 2012-11-17 ENCOUNTER — Institutional Professional Consult (permissible substitution): Payer: Medicaid Other

## 2012-11-24 ENCOUNTER — Institutional Professional Consult (permissible substitution): Payer: Medicaid Other

## 2012-11-24 DIAGNOSIS — E119 Type 2 diabetes mellitus without complications: Secondary | ICD-10-CM | POA: Insufficient documentation

## 2012-11-24 DIAGNOSIS — R002 Palpitations: Secondary | ICD-10-CM | POA: Insufficient documentation

## 2012-12-03 ENCOUNTER — Encounter: Payer: Self-pay | Admitting: Obstetrics & Gynecology

## 2012-12-04 ENCOUNTER — Other Ambulatory Visit: Payer: Self-pay

## 2012-12-04 DIAGNOSIS — Z1231 Encounter for screening mammogram for malignant neoplasm of breast: Secondary | ICD-10-CM

## 2012-12-10 ENCOUNTER — Ambulatory Visit (INDEPENDENT_AMBULATORY_CARE_PROVIDER_SITE_OTHER): Payer: Medicaid Other | Admitting: Obstetrics & Gynecology

## 2012-12-10 ENCOUNTER — Other Ambulatory Visit: Payer: Self-pay | Admitting: Obstetrics & Gynecology

## 2012-12-10 VITALS — BP 128/76 | HR 75 | Temp 98.5°F | Wt 340.0 lb

## 2012-12-10 DIAGNOSIS — N970 Female infertility associated with anovulation: Secondary | ICD-10-CM

## 2012-12-10 DIAGNOSIS — N926 Irregular menstruation, unspecified: Secondary | ICD-10-CM

## 2012-12-10 NOTE — Progress Notes (Signed)
Patient is in the office for infertility consult.   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

## 2012-12-10 NOTE — Patient Instructions (Addendum)
Infertility WHAT IS INFERTILITY?  Infertility is usually defined as not being able to get pregnant after trying for one year of regular sexual intercourse without the use of contraceptives. Or not being able to carry a pregnancy to term and have a baby. The infertility rate in the [place] is around 10%. Pregnancy is the result of a chain of events. A woman must release an egg from one of her ovaries (ovulation). The egg must be fertilized by the female sperm. Then it travels through a fallopian tube into the uterus (womb), where it attaches to the wall of the uterus and grows. A man must have enough sperm, and the sperm must join with (fertilize) the egg along the way, at the proper time. The fertilized egg must then become attached to the inside of the uterus. While this may seem simple, many things can happen to prevent pregnancy from occurring.  WHOSE PROBLEM IS IT?  About 20% of infertility cases are due to problems with the man (female factors) and 65% are due to problems with the woman (female factors). Other cases are due to a combination of female and female factors or to unknown causes.  WHAT CAUSES INFERTILITY IN MEN?  Infertility in men is often caused by problems with making enough normal sperm or getting the sperm to reach the egg. Problems with sperm may exist from birth or develop later in life, due to illness or injury. Some men produce no sperm, or produce too few sperm (oligospermia). Other problems include:  Sexual dysfunction.  Hormonal or endocrine problems.  Age. Female fertility decreases with age, but not at as young an age as female fertility.  Infection.  Congenital problems. Birth defect, such as absence of the tubes that carry the sperm (vas deferens).  Genetic/chromosomal problems.  Antisperm antibody problems.  Retrograde ejaculation (sperm go into the bladder).  Varicoceles, spematoceles, or tumors of the testicles.  Lifestyle can influence the number and  quality of a man's sperm.  Alcohol and drugs can temporarily reduce sperm quality.  Environmental toxins, including pesticides and lead, may cause some cases of infertility in men. WHAT CAUSES INFERTILITY IN WOMEN?   Problems with ovulation account for most infertility in women. Without ovulation, eggs are not available to be fertilized.  Signs of problems with ovulation include irregular menstrual periods or no periods at all.  Simple lifestyle factors, including stress, diet, or athletic training, can affect a woman's hormonal balance.  Age. Fertility begins to decrease in women in the early 30s and is worse after age 37.  Much less often, a hormonal imbalance from a serious medical problem, such as a pituitary gland tumor, thyroid or other chronic medical disease, can cause ovulation problems.  Pelvic infections.  Polycystic ovary syndrome (increase in female hormones, unable to ovulate).  Alcohol or illegal drugs.  Environmental toxins, radiation, pesticides, and certain chemicals.  Aging is an important factor in female infertility.  The ability of a woman's ovaries to produce eggs declines with age, especially after age 35. About one third of couples where the woman is over 35 will have problems with fertility.  By the time she reaches menopause when her monthly periods stop for good, a woman can no longer produce eggs or become pregnant.  Other problems can also lead to infertility in women. If the fallopian tubes are blocked at one or both ends, the egg cannot travel through the tubes into the uterus. Scar tissue (adhesions) in the pelvis may cause blocked   tubes. This may result from pelvic inflammatory disease, endometriosis, or surgery for an ectopic pregnancy (fertilized egg implanted outside the uterus) or any pelvic or abdominal surgery causing adhesions.  Fibroid tumors or polyps of the uterus.  Congenital (birth defect) abnormalities of the uterus.  Infection of the  cervix (cervicitis).  Cervical stenosis (narrowing).  Abnormal cervical mucus.  Polycystic ovary syndrome.  Having sexual intercourse too often (every other day or 4 to 5 times a week).  Obesity.  Anorexia.  Poor nutrition.  Over exercising, with loss of body fat.  DES. Your mother received diethylstilbesterol hormone when pregnant with you. HOW IS INFERTILITY TESTED?  If you have been trying to have a baby without success, you may want to seek medical help. You should not wait for one year of trying before seeing a health care provider if:  You are over 35.  You have reason to believe that there may be a fertility problem. A medical evaluation may determine the reasons for a couple's infertility. Usually this process begins with:  Physical exams.  Medical histories of both partners.  Sexual histories of both partners. If there is no obvious problem, like improperly timed intercourse or absence of ovulation, tests may be needed.   For a man, testing usually begins with tests of his semen to look at:  The number of sperm.  The shape of sperm.  Movement of his sperm.  Taking a complete medical and surgical history.  Physical examination.  Check for infection of the female reproductive organs. Sometimes hormone tests are done.   For a woman, the first step in testing is to find out if she is ovulating each month. There are several ways to do this. For example, she can keep track of changes in her morning body temperature and in the texture of her cervical mucus. Another tool is a home ovulation test kit, which can be bought at drug or grocery stores.  Checks of ovulation can also be done in the doctor's office, using blood tests for hormone levels or ultrasound tests of the ovaries. If the woman is ovulating, more tests will need to be done. Some common female tests include:  Hysterosalpingogram: An x-ray of the fallopian tubes and uterus after they are injected with  dye. It shows if the tubes are open and shows the shape of the uterus.  Laparoscopy: An exam of the tubes and other female organs for disease. A lighted tube called a laparoscope is used to see inside the abdomen.  Endometrial biopsy: Sample of uterus tissue taken on the first day of the menstrual period, to see if the tissue indicates you are ovulating.  Transvaginal ultrasound: Examines the female organs.  Hysteroscopy: Uses a lighted tube to examine the cervix and inside the uterus, to see if there are any abnormalities inside the uterus. TREATMENT  Depending on the test results, different treatments can be suggested. The type of treatment depends on the cause. 85 to 90% of infertility cases are treated with drugs or surgery.   Various fertility drugs may be used for women with ovulation problems. It is important to talk with your caregiver about the drug to be used. You should understand the drug's benefits and side effects. Depending on the type of fertility drug and the dosage of the drug used, multiple births (twins or multiples) can occur in some women.  If needed, surgery can be done to repair damage to a woman's ovaries, fallopian tubes, cervix, or uterus.  Surgery   or medical treatment for endometriosis or polycystic ovary syndrome. Sometimes a man has an infertility problem that can be corrected with medicine or by surgery.  Intrauterine insemination (IUI) of sperm, timed with ovulation.  Change in lifestyle, if that is the cause (lose weight, increase exercise, and stop smoking, drinking excessively, or taking illegal drugs).  Other types of surgery:  Removing growths inside and on the uterus.  Removing scar tissue from inside of the uterus.  Fixing blocked tubes.  Removing scar tissue in the pelvis and around the female organs. WHAT IS ASSISTED REPRODUCTIVE TECHNOLOGY (ART)?  Assisted reproductive technology (ART) is another form of special methods used to help infertile  couples. ART involves handling both the woman's eggs and the man's sperm. Success rates vary and depend on many factors. ART can be expensive and time-consuming. But ART has made it possible for many couples to have children that otherwise would not have been conceived. Some methods are listed below:  In vitro fertilization (IVF). IVF is often used when a woman's fallopian tubes are blocked or when a man has low sperm counts. A drug is used to stimulate the ovaries to produce multiple eggs. Once mature, the eggs are removed and placed in a culture dish with the man's sperm for fertilization. After about 40 hours, the eggs are examined to see if they have become fertilized by the sperm and are dividing into cells. These fertilized eggs (embryos) are then placed in the woman's uterus. This bypasses the fallopian tubes.  Gamete intrafallopian transfer (GIFT) is similar to IVF, but used when the woman has at least one normal fallopian tube. Three to five eggs are placed in the fallopian tube, along with the man's sperm, for fertilization inside the woman's body.  Zygote intrafallopian transfer (ZIFT), also called tubal embryo transfer, combines IVF and GIFT. The eggs retrieved from the woman's ovaries are fertilized in the lab and placed in the fallopian tubes rather than in the uterus.  ART procedures sometimes involve the use of donor eggs (eggs from another woman) or previously frozen embryos. Donor eggs may be used if a woman has impaired ovaries or has a genetic disease that could be passed on to her baby.  When performing ART, you are at higher risk for resulting in multiple pregnancies, twins, triplets or more.  Intracytoplasma sperm injection is a procedure that injects a single sperm into the egg to fertilize it.  Embryo transplant is a procedure that starts after growing an embryo in a special media (chemical solution) developed to keep the embryo alive for 2 to 5 days, and then transplanting it  into the uterus. In cases where a cause cannot be found and pregnancy does not occur, adoption may be a consideration. Document Released: 06/27/2003 Document Revised: 09/16/2011 Document Reviewed: 05/23/2009 [employer] Patient Information 2014 [employer].  

## 2012-12-11 ENCOUNTER — Encounter: Payer: Self-pay | Admitting: Obstetrics & Gynecology

## 2012-12-11 DIAGNOSIS — N970 Female infertility associated with anovulation: Secondary | ICD-10-CM | POA: Insufficient documentation

## 2012-12-15 ENCOUNTER — Ambulatory Visit (HOSPITAL_COMMUNITY): Admission: RE | Admit: 2012-12-15 | Payer: Medicaid Other | Source: Ambulatory Visit

## 2012-12-21 ENCOUNTER — Ambulatory Visit (HOSPITAL_COMMUNITY)
Admission: RE | Admit: 2012-12-21 | Discharge: 2012-12-21 | Disposition: A | Discharge status: Home / Self Care | Payer: Medicaid Other | Source: Ambulatory Visit | Attending: Obstetrics & Gynecology | Admitting: Obstetrics & Gynecology

## 2012-12-21 DIAGNOSIS — N854 Malposition of uterus: Secondary | ICD-10-CM | POA: Insufficient documentation

## 2012-12-21 DIAGNOSIS — N926 Irregular menstruation, unspecified: Secondary | ICD-10-CM

## 2012-12-21 DIAGNOSIS — D259 Leiomyoma of uterus, unspecified: Secondary | ICD-10-CM | POA: Insufficient documentation

## 2012-12-28 ENCOUNTER — Ambulatory Visit
Admission: RE | Admit: 2012-12-28 | Discharge: 2012-12-28 | Disposition: A | Discharge status: Home / Self Care | Payer: Medicaid Other | Source: Ambulatory Visit | Attending: Cardiovascular Disease | Admitting: Cardiovascular Disease

## 2012-12-28 ENCOUNTER — Other Ambulatory Visit: Payer: Self-pay | Admitting: Cardiovascular Disease

## 2012-12-28 DIAGNOSIS — R0602 Shortness of breath: Secondary | ICD-10-CM

## 2013-01-07 ENCOUNTER — Ambulatory Visit
Admission: RE | Admit: 2013-01-07 | Discharge: 2013-01-07 | Disposition: A | Discharge status: Home / Self Care | Payer: Medicaid Other | Source: Ambulatory Visit

## 2013-01-07 DIAGNOSIS — Z1231 Encounter for screening mammogram for malignant neoplasm of breast: Secondary | ICD-10-CM

## 2013-07-24 ENCOUNTER — Emergency Department (HOSPITAL_COMMUNITY): Payer: Medicaid Other

## 2013-07-24 ENCOUNTER — Emergency Department (HOSPITAL_COMMUNITY)
Admission: EM | Admit: 2013-07-24 | Discharge: 2013-07-24 | Disposition: A | Discharge status: Home / Self Care | Payer: Medicaid Other | Attending: Emergency Medicine | Admitting: Emergency Medicine

## 2013-07-24 ENCOUNTER — Encounter (HOSPITAL_COMMUNITY): Payer: Self-pay | Admitting: Emergency Medicine

## 2013-07-24 DIAGNOSIS — Z8639 Personal history of other endocrine, nutritional and metabolic disease: Secondary | ICD-10-CM | POA: Insufficient documentation

## 2013-07-24 DIAGNOSIS — R51 Headache: Secondary | ICD-10-CM | POA: Insufficient documentation

## 2013-07-24 DIAGNOSIS — Z79899 Other long term (current) drug therapy: Secondary | ICD-10-CM | POA: Insufficient documentation

## 2013-07-24 DIAGNOSIS — J069 Acute upper respiratory infection, unspecified: Secondary | ICD-10-CM | POA: Insufficient documentation

## 2013-07-24 DIAGNOSIS — N926 Irregular menstruation, unspecified: Secondary | ICD-10-CM | POA: Insufficient documentation

## 2013-07-24 DIAGNOSIS — Z880 Allergy status to penicillin: Secondary | ICD-10-CM | POA: Insufficient documentation

## 2013-07-24 DIAGNOSIS — R52 Pain, unspecified: Secondary | ICD-10-CM | POA: Insufficient documentation

## 2013-07-24 DIAGNOSIS — Z87448 Personal history of other diseases of urinary system: Secondary | ICD-10-CM | POA: Insufficient documentation

## 2013-07-24 DIAGNOSIS — E669 Obesity, unspecified: Secondary | ICD-10-CM | POA: Insufficient documentation

## 2013-07-24 DIAGNOSIS — Z7982 Long term (current) use of aspirin: Secondary | ICD-10-CM | POA: Insufficient documentation

## 2013-07-24 DIAGNOSIS — R05 Cough: Secondary | ICD-10-CM | POA: Insufficient documentation

## 2013-07-24 DIAGNOSIS — R059 Cough, unspecified: Secondary | ICD-10-CM | POA: Insufficient documentation

## 2013-07-24 DIAGNOSIS — Z862 Personal history of diseases of the blood and blood-forming organs and certain disorders involving the immune mechanism: Secondary | ICD-10-CM | POA: Insufficient documentation

## 2013-07-24 DIAGNOSIS — Z3202 Encounter for pregnancy test, result negative: Secondary | ICD-10-CM | POA: Insufficient documentation

## 2013-07-24 DIAGNOSIS — E119 Type 2 diabetes mellitus without complications: Secondary | ICD-10-CM | POA: Insufficient documentation

## 2013-07-24 DIAGNOSIS — B9789 Other viral agents as the cause of diseases classified elsewhere: Secondary | ICD-10-CM

## 2013-07-24 HISTORY — DX: Other injury of unspecified body region, initial encounter: T14.8XXA

## 2013-07-24 LAB — POCT PREGNANCY, URINE: PREG TEST UR: NEGATIVE

## 2013-07-24 MED ORDER — HYDROCOD POLST-CHLORPHEN POLST 10-8 MG/5ML PO LQCR
5.0000 mL | Freq: Two times a day (BID) | ORAL | Status: DC | PRN
Start: 2013-07-24 — End: 2014-01-11

## 2013-07-24 NOTE — ED Notes (Addendum)
Pt reports onset Thursday with fever, cough and generalized body aches. Pt tried Nyquil without relief. Pt also requesting pregnancy test due to last menstrual period in the beginning of October. 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

## 2013-07-24 NOTE — Discharge Instructions (Signed)
Take the prescribed medication as directed for cough.  Do not take this medication and drive-- it can make you drowsy. Follow-up with your OB-GYN as previously scheduled on Tuesday for further evaluation of irregular menstrual cycles. May follow up with your primary care physician for other concerns. Return to the ED for new or worsening symptoms.

## 2013-07-24 NOTE — ED Provider Notes (Signed)
CSN: [id number]     Arrival date & time 07/24/13  4403 History   First MD Initiated Contact with Patient 07/24/13 [phone]     Chief Complaint  Patient presents with  . Fever    102.8  . Cough  . Headache  . Generalized Body Aches   (Consider location/radiation/quality/duration/timing/severity/associated sxs/prior Treatment) Patient is a 43 y.o. female presenting with fever, cough, and headaches. The history is provided by the patient and medical records.  Fever Associated symptoms: congestion, cough, headaches, myalgias, rhinorrhea and sore throat   Cough Associated symptoms: fever, headaches, myalgias, rhinorrhea and sore throat   Headache Associated symptoms: congestion, cough, fever, myalgias and sore throat    This is a 43 year old female with past medical history significant for hypothyroidism, diabetes, obesity, presenting to the ED for flulike symptoms for the past 3 days.  Specifically patient has had fever, productive cough, sore throat, rhinorrhea, sinus pressure, and generalized body aches. Patient has been taking NyQuil at home without improvement. Highest fever at home was 102F.  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  Denies any chest pain, shortness of breath, palpitations, dizziness, weakness.  Patient also requesting pregnancy test she states her last menstrual cycle was in early October. Patient states she's been very busy lately and has not paid much attention to this. She states cycles are usually regular, every 28 days. She denies any vaginal spotting or vaginal discharge.  Pt is G2P1.  Has FU with OB-GYN on Tuesday, 07/27/13.  Past Medical History  Diagnosis Date  . Hypothyroidism   . Obesity   . Overactive bladder   . Nerve damage in leg  . Diabetes mellitus without complication boderline   Past Surgical History  Procedure Laterality Date  . Cesarean section     Family History  Problem Relation Age of Onset  . Cancer Mother      Ovarian Cancer  . Cancer Father     Prostate Cancer  . Heart disease Other     Grandparent  . Stroke Other   . Hypertension Other   . Diabetes Other     Parent   History  Substance Use Topics  . Smoking status: Never Smoker   . Smokeless tobacco: Not on file  . Alcohol Use: No   OB History   Grav Para Term Preterm Abortions TAB SAB Ect Mult Living                 Review of Systems  Constitutional: Positive for fever.  HENT: Positive for congestion, rhinorrhea and sore throat.   Respiratory: Positive for cough.   Musculoskeletal: Positive for myalgias.  Neurological: Positive for headaches.  All other systems reviewed and are negative.    Allergies  Ibuprofen and Penicillins  Home Medications   Current Outpatient Rx  Name  Route  Sig  Dispense  Refill  . aspirin 81 MG tablet   Oral   Take 81 mg by mouth daily.         . celecoxib (CELEBREX) 200 MG capsule   Oral   Take 200 mg by mouth 2 (two) times daily.         Marland Kitchen darifenacin (ENABLEX) 7.5 MG 24 hr tablet   Oral   Take 7.5 mg by mouth daily.         . metFORMIN (GLUCOPHAGE-XR) 500 MG 24 hr tablet   Oral   Take 500 mg by mouth daily with breakfast.         .  solifenacin (VESICARE) 10 MG tablet   Oral   Take 10 mg by mouth daily.          BP 110/59  Pulse 88  Temp(Src) 98.8 F (37.1 C) (Oral)  Resp 22  Ht 5\' 4"  (1.626 m)  Wt 328 lb (148.78 kg)  BMI 56.27 kg/m2  SpO2 98%  LMP 04/13/2013  Physical Exam  Nursing note and vitals reviewed. Constitutional: She is oriented to person, place, and time. She appears well-developed and well-nourished. No distress.  HENT:  Head: Normocephalic and atraumatic.  Right Ear: Tympanic membrane and ear canal normal.  Left Ear: Tympanic membrane and ear canal normal.  Nose: Mucosal edema and rhinorrhea present.  Mouth/Throat: Oropharynx is clear and moist.  Turbinates swollen and erythematous, clear rhinorrhea present; PND in oropharynx; tonsils  normal in appearance bilaterally; uvula remains midline without peritonsillar abscess  Eyes: Conjunctivae and EOM are normal. Pupils are equal, round, and reactive to light.  Neck: Normal range of motion. Neck supple.  Cardiovascular: Normal rate, regular rhythm and normal heart sounds.   Pulmonary/Chest: No accessory muscle usage. No respiratory distress. She has no decreased breath sounds. She has wheezes. She has no rhonchi.  Slight wheeze right lung base  Abdominal: Soft. Bowel sounds are normal. There is no tenderness. There is no guarding.  Musculoskeletal: Normal range of motion. She exhibits no edema.  Neurological: She is alert and oriented to person, place, and time.  Skin: Skin is warm and dry. She is not diaphoretic.  Psychiatric: She has a normal mood and affect.    ED Course  Procedures (including critical care time) Labs Review Labs Reviewed  POCT PREGNANCY, URINE   Imaging Review Dg Chest 2 View  07/24/2013   CLINICAL DATA:  Fever.  Cough.  Body aches.  EXAM: CHEST  2 VIEW  COMPARISON:  12/28/2012  FINDINGS: The heart size and mediastinal contours are within normal limits. Both lungs are clear. The visualized skeletal structures are unremarkable.  IMPRESSION: No active cardiopulmonary disease.   Electronically Signed   By: Earle Gell M.D.   On: 07/24/2013 09:36    EKG Interpretation   None       MDM   1. Viral URI with cough   2. Menstrual irregularity    Urine preg negative.  Suspect that menstrual irregularities due to peri-menopausal changes.  Has previously scheduled FU with OB-GYN on Tuesday-- will discuss this with them. Chest x-ray negative for acute disease, suspect constellation of symptoms viral in nature. Pt afebrile, non-toxic appearing, NAD, VS stable- ok for discharge.  Rx tussionex.  FU with PCP.  Discussed plan with pt and husband, acknowledged understanding and agreed with plan of care.  Return precautions given.   Larene Pickett,  PA-C 07/24/13 [phone]

## 2013-07-24 NOTE — ED Provider Notes (Signed)
Medical screening examination/treatment/procedure(s) were performed by non-physician practitioner and as supervising physician I was immediately available for consultation/collaboration.  EKG Interpretation   None       Salote Weidmann, MD, FACEP   Lam Bjorklund L Denelda Akerley, MD 07/24/13 1554 

## 2013-07-27 ENCOUNTER — Other Ambulatory Visit: Payer: Medicaid Other

## 2013-08-20 ENCOUNTER — Ambulatory Visit: Payer: Medicaid Other | Admitting: Obstetrics & Gynecology

## 2013-08-23 ENCOUNTER — Encounter: Payer: Self-pay | Admitting: Obstetrics & Gynecology

## 2013-08-23 ENCOUNTER — Ambulatory Visit (INDEPENDENT_AMBULATORY_CARE_PROVIDER_SITE_OTHER): Payer: Medicaid Other | Admitting: Obstetrics & Gynecology

## 2013-08-23 VITALS — BP 134/76 | HR 98 | Temp 97.9°F | Ht 64.0 in | Wt 334.0 lb

## 2013-08-23 DIAGNOSIS — N926 Irregular menstruation, unspecified: Secondary | ICD-10-CM

## 2013-08-23 MED ORDER — MEDROXYPROGESTERONE ACETATE 10 MG PO TABS: 10.0000 mg | ORAL_TABLET | Freq: Every day | ORAL | Status: DC

## 2013-08-23 NOTE — Progress Notes (Signed)
Subjective:     Peggy Avery is a 43 y.o. female here for a routine exam.  Current complaints: Patient in office today for a problem visit. Patient states she is having night sweats and migraines. Patient states she has not had cycle in 4 months. Patient states she has not had any spotting. Patient states her cycles are normally regular. Patient states last cycle she had was normal. Patient states she is under a lot stress at work. Patient would like to know if she could have a blood test done if UPT comes back negative. Patient states she is having pelvic heaviness.  Personal health questionnaire reviewed: yes.   Gynecologic History Patient's last menstrual period was 04/13/2013. Contraception: none Last Pap: 04/22/2012. Results were: normal Last mammogram: 2014. Results were: normal   The following portions of the patient's history were reviewed and updated as appropriate: allergies, current medications, past family history, past medical history, past social history, past surgical history and problem list.  Review of Systems Pertinent items are noted in HPI.   Objective:   Pelvic: cervix normal in appearance, external genitalia normal, no adnexal masses or tenderness and bimanual exam limited by body habitus   Assessment:   AUB--O--obesity/stress/?perimenopausal  Plan:  TSH. FSH, estradiol, prolactin, Serum qualitative HCG Pelvic Ultrasound No unprotected intercourse x 2 weeks Provera  qd x 12 days

## 2013-08-23 NOTE — Patient Instructions (Signed)

## 2013-08-24 LAB — TSH: TSH: 2.896 u[IU]/mL (ref 0.350–4.500)

## 2013-08-24 LAB — PROLACTIN: PROLACTIN: 12.8 ng/mL

## 2013-08-24 LAB — FOLLICLE STIMULATING HORMONE: FSH: 18.7 m[IU]/mL

## 2013-08-24 LAB — ESTRADIOL: ESTRADIOL: 60.1 pg/mL

## 2013-08-24 LAB — HCG, SERUM, QUALITATIVE: PREG SERUM: NEGATIVE

## 2013-08-25 ENCOUNTER — Other Ambulatory Visit: Payer: Self-pay | Admitting: *Deleted

## 2013-08-25 DIAGNOSIS — N926 Irregular menstruation, unspecified: Secondary | ICD-10-CM

## 2013-09-01 ENCOUNTER — Ambulatory Visit (HOSPITAL_COMMUNITY): Admission: RE | Admit: 2013-09-01 | Payer: Medicaid Other | Source: Ambulatory Visit

## 2013-09-06 ENCOUNTER — Ambulatory Visit: Payer: Medicaid Other | Admitting: Obstetrics & Gynecology

## 2013-09-09 ENCOUNTER — Ambulatory Visit (HOSPITAL_COMMUNITY)
Admission: RE | Admit: 2013-09-09 | Discharge: 2013-09-09 | Disposition: A | Discharge status: Home / Self Care | Payer: Medicaid Other | Source: Ambulatory Visit | Attending: Obstetrics & Gynecology | Admitting: Obstetrics & Gynecology

## 2013-09-09 DIAGNOSIS — N938 Other specified abnormal uterine and vaginal bleeding: Secondary | ICD-10-CM | POA: Insufficient documentation

## 2013-09-09 DIAGNOSIS — N854 Malposition of uterus: Secondary | ICD-10-CM | POA: Insufficient documentation

## 2013-09-09 DIAGNOSIS — N926 Irregular menstruation, unspecified: Secondary | ICD-10-CM

## 2013-09-09 DIAGNOSIS — N949 Unspecified condition associated with female genital organs and menstrual cycle: Secondary | ICD-10-CM | POA: Insufficient documentation

## 2013-09-16 ENCOUNTER — Ambulatory Visit: Payer: Medicaid Other | Admitting: Obstetrics & Gynecology

## 2013-11-05 DIAGNOSIS — G459 Transient cerebral ischemic attack, unspecified: Secondary | ICD-10-CM

## 2013-11-05 HISTORY — DX: Transient cerebral ischemic attack, unspecified: G45.9

## 2013-11-08 ENCOUNTER — Ambulatory Visit
Admission: RE | Admit: 2013-11-08 | Discharge: 2013-11-08 | Disposition: A | Discharge status: Home / Self Care | Payer: Medicaid Other | Source: Ambulatory Visit | Attending: Cardiovascular Disease | Admitting: Cardiovascular Disease

## 2013-11-08 ENCOUNTER — Other Ambulatory Visit: Payer: Self-pay | Admitting: Cardiovascular Disease

## 2013-11-08 DIAGNOSIS — R112 Nausea with vomiting, unspecified: Secondary | ICD-10-CM

## 2013-11-08 DIAGNOSIS — R109 Unspecified abdominal pain: Secondary | ICD-10-CM

## 2013-11-28 ENCOUNTER — Other Ambulatory Visit: Payer: Self-pay | Admitting: Diagnostic Neuroimaging

## 2013-11-29 NOTE — Telephone Encounter (Signed)
Patient has not been seen since 2013.

## 2013-12-02 ENCOUNTER — Other Ambulatory Visit: Payer: Self-pay

## 2013-12-02 DIAGNOSIS — Z1231 Encounter for screening mammogram for malignant neoplasm of breast: Secondary | ICD-10-CM

## 2014-01-10 ENCOUNTER — Inpatient Hospital Stay: Admission: RE | Admit: 2014-01-10 | Payer: Medicaid Other | Source: Ambulatory Visit

## 2014-01-11 ENCOUNTER — Ambulatory Visit (INDEPENDENT_AMBULATORY_CARE_PROVIDER_SITE_OTHER): Payer: Medicaid Other | Admitting: Pulmonary Disease

## 2014-01-11 ENCOUNTER — Ambulatory Visit
Admission: RE | Admit: 2014-01-11 | Discharge: 2014-01-11 | Disposition: A | Discharge status: Home / Self Care | Payer: Medicaid Other | Source: Ambulatory Visit

## 2014-01-11 ENCOUNTER — Encounter: Payer: Self-pay | Admitting: Pulmonary Disease

## 2014-01-11 VITALS — BP 120/80 | HR 78 | Temp 98.2°F | Ht 64.0 in | Wt 343.8 lb

## 2014-01-11 DIAGNOSIS — G4733 Obstructive sleep apnea (adult) (pediatric): Secondary | ICD-10-CM

## 2014-01-11 DIAGNOSIS — Z1231 Encounter for screening mammogram for malignant neoplasm of breast: Secondary | ICD-10-CM

## 2014-01-11 NOTE — Patient Instructions (Signed)
Schedule sleep study You probably have obstructive sleep apnea   

## 2014-01-11 NOTE — Assessment & Plan Note (Signed)
Given excessive daytime somnolence, narrow pharyngeal exam, witnessed apneas & loud snoring, obstructive sleep apnea is very likely & an overnight polysomnogram will be scheduled as a split study. The pathophysiology of obstructive sleep apnea , it's cardiovascular consequences & modes of treatment including CPAP were discused with the patient in detail & they evidenced understanding.  

## 2014-01-11 NOTE — Progress Notes (Signed)
Subjective:    Patient ID: Peggy Avery, female    DOB: 09-23-1970, 43 y.o.   MRN: [id number]  HPI  43 year old morbidly obese woman presents for evaluation of sleep disordered breathing. She reports chronic headaches and increased daytime fatigue. Loud snoring has been noted by her husband for the past 2 months after she had a mini stroke in 11/2013. She reports restless and not refreshing sleep and feels like "she has been fighting in her sleep" Epworth sleepiness score is 12 Bedtime is around 11 PM, as late as 1 AM, sleep latency is minimal, she sleeps on her side with 4 pillows, reports to 3 nocturnal awakenings including bathroom visits without any post void sleep latency and is out of bed at 7 AM feeling tired with occasional headache  That requires Tylenol before it subsides in an hour. She has gained 40 pounds over the last 2 years, TSH was normal There is no history suggestive of cataplexy, sleep paralysis or parasomnias   Past Medical History  Diagnosis Date  . Hypothyroidism   . Obesity   . Overactive bladder   . Nerve damage in leg  . Diabetes mellitus without complication boderline  . Stroke     Past Surgical History  Procedure Laterality Date  . Cesarean section      Allergies  Allergen Reactions  . Ibuprofen Swelling  . Penicillins     History   Social History  . Marital Status: Single    Spouse Name: N/A    Number of Children: 1  . Years of Education: N/A   Occupational History  . unemployed    Social History Main Topics  . Smoking status: Never Smoker   . Smokeless tobacco: Never Used  . Alcohol Use: No  . Drug Use: No  . Sexual Activity: Yes    Partners: Male    Birth Control/ Protection: None   Other Topics Concern  . Not on file   Social History Narrative  . No narrative on file    Family History  Problem Relation Age of Onset  . Ovarian cancer Mother     Ovarian Cancer  . Prostate cancer Father     Prostate Cancer  . Heart  disease Other     Grandparent  . Diabetes Other     Parent      Review of Systems  Constitutional: Negative for fever and unexpected weight change.  HENT: Negative for congestion, dental problem, ear pain, nosebleeds, postnasal drip, rhinorrhea, sinus pressure, sneezing, sore throat and trouble swallowing.   Eyes: Negative for redness and itching.  Respiratory: Negative for cough, chest tightness, shortness of breath and wheezing.   Cardiovascular: Negative for palpitations and leg swelling.  Gastrointestinal: Negative for nausea and vomiting.  Genitourinary: Negative for dysuria.  Musculoskeletal: Negative for joint swelling.  Skin: Negative for rash.  Neurological: Negative for headaches.  Hematological: Does not bruise/bleed easily.  Psychiatric/Behavioral: Negative for dysphoric mood. The patient is not nervous/anxious.        Objective:   Physical Exam  Gen. Pleasant, obese, in no distress, normal affect ENT - no lesions, no post nasal drip, class 2-3 airway Neck: No JVD, no thyromegaly, no carotid bruits Lungs: no use of accessory muscles, no dullness to percussion, decreased without rales or rhonchi  Cardiovascular: Rhythm regular, heart sounds  normal, no murmurs or gallops, no peripheral edema Abdomen: soft and non-tender, no hepatosplenomegaly, BS normal. Musculoskeletal: No deformities, no cyanosis or clubbing Neuro:  alert, non focal,  no tremors       Assessment & Plan:

## 2014-01-13 ENCOUNTER — Other Ambulatory Visit: Payer: Self-pay | Admitting: Obstetrics & Gynecology

## 2014-01-13 DIAGNOSIS — R928 Other abnormal and inconclusive findings on diagnostic imaging of breast: Secondary | ICD-10-CM

## 2014-01-28 ENCOUNTER — Other Ambulatory Visit: Payer: Self-pay

## 2014-02-12 ENCOUNTER — Emergency Department (HOSPITAL_COMMUNITY)
Admission: EM | Admit: 2014-02-12 | Discharge: 2014-02-12 | Disposition: A | Discharge status: Home / Self Care | Payer: Medicaid Other | Attending: Emergency Medicine | Admitting: Emergency Medicine

## 2014-02-12 ENCOUNTER — Encounter (HOSPITAL_COMMUNITY): Payer: Self-pay | Admitting: Emergency Medicine

## 2014-02-12 ENCOUNTER — Emergency Department (HOSPITAL_COMMUNITY): Payer: Medicaid Other

## 2014-02-12 DIAGNOSIS — Z79899 Other long term (current) drug therapy: Secondary | ICD-10-CM | POA: Insufficient documentation

## 2014-02-12 DIAGNOSIS — Z8673 Personal history of transient ischemic attack (TIA), and cerebral infarction without residual deficits: Secondary | ICD-10-CM | POA: Diagnosis not present

## 2014-02-12 DIAGNOSIS — J40 Bronchitis, not specified as acute or chronic: Secondary | ICD-10-CM | POA: Insufficient documentation

## 2014-02-12 DIAGNOSIS — E119 Type 2 diabetes mellitus without complications: Secondary | ICD-10-CM | POA: Insufficient documentation

## 2014-02-12 DIAGNOSIS — M5380 Other specified dorsopathies, site unspecified: Secondary | ICD-10-CM | POA: Diagnosis not present

## 2014-02-12 DIAGNOSIS — R55 Syncope and collapse: Secondary | ICD-10-CM | POA: Insufficient documentation

## 2014-02-12 DIAGNOSIS — Z880 Allergy status to penicillin: Secondary | ICD-10-CM | POA: Diagnosis not present

## 2014-02-12 DIAGNOSIS — Z7982 Long term (current) use of aspirin: Secondary | ICD-10-CM | POA: Diagnosis not present

## 2014-02-12 DIAGNOSIS — M62830 Muscle spasm of back: Secondary | ICD-10-CM

## 2014-02-12 DIAGNOSIS — N318 Other neuromuscular dysfunction of bladder: Secondary | ICD-10-CM | POA: Insufficient documentation

## 2014-02-12 DIAGNOSIS — G44219 Episodic tension-type headache, not intractable: Secondary | ICD-10-CM

## 2014-02-12 DIAGNOSIS — R110 Nausea: Secondary | ICD-10-CM | POA: Diagnosis not present

## 2014-02-12 LAB — BASIC METABOLIC PANEL
ANION GAP: 12 (ref 5–15)
BUN: 13 mg/dL (ref 6–23)
CALCIUM: 9.6 mg/dL (ref 8.4–10.5)
CO2: 24 meq/L (ref 19–32)
CREATININE: 0.82 mg/dL (ref 0.50–1.10)
Chloride: 102 mEq/L (ref 96–112)
GFR calc Af Amer: >90 mL/min (ref >90)
GFR, EST NON AFRICAN AMERICAN: 85 mL/min — AB (ref >90)
Glucose, Bld: 114 mg/dL — ABNORMAL HIGH (ref 70–99)
Potassium: 4.4 mEq/L (ref 3.7–5.3)
SODIUM: 138 meq/L (ref 137–147)

## 2014-02-12 LAB — I-STAT TROPONIN, ED: Troponin i, poc: 0 ng/mL (ref 0.00–0.08)

## 2014-02-12 LAB — URINALYSIS, ROUTINE W REFLEX MICROSCOPIC
Bilirubin Urine: NEGATIVE
GLUCOSE, UA: NEGATIVE mg/dL
Ketones, ur: NEGATIVE mg/dL
Leukocytes, UA: NEGATIVE
Nitrite: NEGATIVE
PROTEIN: NEGATIVE mg/dL
SPECIFIC GRAVITY, URINE: 1.007 (ref 1.005–1.030)
Urobilinogen, UA: 0.2 mg/dL (ref 0.0–1.0)
pH: 6.5 (ref 5.0–8.0)

## 2014-02-12 LAB — CBC
HCT: 36.8 % (ref 36.0–46.0)
Hemoglobin: 12.1 g/dL (ref 12.0–15.0)
MCH: 28.8 pg (ref 26.0–34.0)
MCHC: 32.9 g/dL (ref 30.0–36.0)
MCV: 87.6 fL (ref 78.0–100.0)
PLATELETS: 332 10*3/uL (ref 150–400)
RBC: 4.2 MIL/uL (ref 3.87–5.11)
RDW: 13.4 % (ref 11.5–15.5)
WBC: 11.1 10*3/uL — ABNORMAL HIGH (ref 4.0–10.5)

## 2014-02-12 LAB — POC URINE PREG, ED: Preg Test, Ur: NEGATIVE

## 2014-02-12 LAB — URINE MICROSCOPIC-ADD ON

## 2014-02-12 LAB — CBG MONITORING, ED: Glucose-Capillary: 105 mg/dL — ABNORMAL HIGH (ref 70–99)

## 2014-02-12 LAB — LIPASE, BLOOD: Lipase: 30 U/L (ref 11–59)

## 2014-02-12 MED ORDER — SODIUM CHLORIDE 0.9 % IV BOLUS (SEPSIS)
1000.0000 mL | Freq: Once | INTRAVENOUS | Status: AC
  Administered 2014-02-12: 1000 mL via INTRAVENOUS

## 2014-02-12 MED ORDER — DIAZEPAM 5 MG PO TABS: 5.0000 mg | ORAL_TABLET | Freq: Three times a day (TID) | ORAL | Status: DC | PRN

## 2014-02-12 MED ORDER — ALBUTEROL SULFATE HFA 108 (90 BASE) MCG/ACT IN AERS
1.0000 | INHALATION_SPRAY | Freq: Four times a day (QID) | RESPIRATORY_TRACT | Status: DC | PRN
Start: 2014-02-12 — End: 2015-12-26

## 2014-02-12 MED ORDER — ONDANSETRON HCL 4 MG/2ML IJ SOLN
4.0000 mg | Freq: Once | INTRAMUSCULAR | Status: AC
  Administered 2014-02-12: 4 mg via INTRAVENOUS
  Filled 2014-02-12: qty 2

## 2014-02-12 MED ORDER — SULFAMETHOXAZOLE-TRIMETHOPRIM 800-160 MG PO TABS: 1.0000 | ORAL_TABLET | Freq: Two times a day (BID) | ORAL | Status: AC

## 2014-02-12 MED ORDER — DIPHENHYDRAMINE HCL 50 MG/ML IJ SOLN
25.0000 mg | Freq: Once | INTRAMUSCULAR | Status: AC
  Administered 2014-02-12: 25 mg via INTRAVENOUS
  Filled 2014-02-12: qty 1

## 2014-02-12 MED ORDER — LORAZEPAM 2 MG/ML IJ SOLN
1.0000 mg | Freq: Once | INTRAMUSCULAR | Status: AC
  Administered 2014-02-12: 1 mg via INTRAVENOUS
  Filled 2014-02-12: qty 1

## 2014-02-12 MED ORDER — OXYCODONE-ACETAMINOPHEN 5-325 MG PO TABS
1.0000 | ORAL_TABLET | Freq: Once | ORAL | Status: AC
  Administered 2014-02-12: 1 via ORAL
  Filled 2014-02-12: qty 1

## 2014-02-12 MED ORDER — MORPHINE SULFATE 4 MG/ML IJ SOLN
4.0000 mg | Freq: Once | INTRAMUSCULAR | Status: DC
  Filled 2014-02-12: qty 1

## 2014-02-12 NOTE — ED Notes (Signed)
Pt very sleepy and stating that her pain is 8/10,  I had to wake her to assess pain level and then she fell asleep again

## 2014-02-12 NOTE — ED Notes (Signed)
Pt was assisted from vehicle.  States that for the past 3 days, she has felt like she is going to pass out.  C/o "foggy head" and feeling dizzy w/ nausea, no vomiting.  Pt refuses to participate in stroke screen.  States that she can't.  Pt states "just help me, just help me.  Symmetrical face, no slurred speech.

## 2014-02-12 NOTE — Discharge Instructions (Signed)
Read the information below.  Use the prescribed medication as directed.  Please discuss all new medications with your pharmacist.  You may return to the Emergency Department at any time for worsening condition or any new symptoms that concern you.  If you develop high fevers that do not resolve with tylenol, you have difficulty swallowing or breathing, or you are unable to tolerate fluids by mouth, return to the ER for a recheck.     You are having a headache. No specific cause was found today for your headache. It may have been a migraine or other cause of headache. Stress, anxiety, fatigue, and depression are common triggers for headaches. Your headache today does not appear to be life-threatening or require hospitalization, but often the exact cause of headaches is not determined in the emergency department. Therefore, follow-up with your doctor is very important to find out what may have caused your headache, and whether or not you need any further diagnostic testing or treatment. Sometimes headaches can appear benign (not harmful), but then more serious symptoms can develop which should prompt an immediate re-evaluation by your doctor or the emergency department. SEEK MEDICAL ATTENTION IF: You develop possible problems with medications prescribed.  The medications don't resolve your headache, if it recurs , or if you have multiple episodes of vomiting or can't take fluids. You have a change from the usual headache. RETURN IMMEDIATELY IF you develop a sudden, severe headache or confusion, become poorly responsive or faint, develop a fever above 100.29F or problem breathing, have a change in speech, vision, swallowing, or understanding, or develop new weakness, numbness, tingling, incoordination, or have a seizure.

## 2014-02-12 NOTE — ED Provider Notes (Signed)
CSN: [id number]     Arrival date & time 02/12/14  1526 History   First MD Initiated Contact with Patient 02/12/14 1603     Chief Complaint  Patient presents with  . Near Syncope  . Weakness  . Nausea     (Consider location/radiation/quality/duration/timing/severity/associated sxs/prior Treatment) The history is provided by the patient and a friend.    Pt with hx DM, obesity, stroke p/w multiple complaints.  She has been sick with flu-like symptoms for the past week including bilateral ear pain, nasal congestion, sore throat, cough productive of yellow sputum, right sided headache (her typical frequent headache).  For the past 3 days she has felt lightheaded and a heaviness in her entire body - states if she doesn't eat every 2-3 hours she gets more lightheaded, but eating also makes the lightheadedness worse.  She has had numbness in her throat that makes swallowing feel strange - this began when she got to the ED. Has had urinary frequency x 1 week.    Has also had chronic left breast pain, worse with palpation - this has been ongoing for sometime and is unchanged - has seen PCP Dr Doylene Canard for this and has had EKGs and mammogram (mamogram will need to be repeated per pt).  States the pain in her breast affects her breathing.   Has right sided abdominal pain only associated with attempting to defecate.  BMs are unchanged - chronic problems with constipation from iron pills, uses miralax to combat this.    Denies vomiting or diarrhea, change in her stools.  Denies LOC, falls, dizziness (spinning).    Has tried tylenol, aspirin, and leftover biaxin for her symptoms without improvement.       PCP Dr Doylene Canard.   Past Medical History  Diagnosis Date  . Hypothyroidism   . Obesity   . Overactive bladder   . Nerve damage in leg  . Diabetes mellitus without complication boderline  . Stroke    Past Surgical History  Procedure Laterality Date  . Cesarean section     Family History    Problem Relation Age of Onset  . Ovarian cancer Mother     Ovarian Cancer  . Prostate cancer Father     Prostate Cancer  . Heart disease Other     Grandparent  . Diabetes Other     Parent   History  Substance Use Topics  . Smoking status: Never Smoker   . Smokeless tobacco: Never Used  . Alcohol Use: No   OB History   Grav Para Term Preterm Abortions TAB SAB Ect Mult Living   2 1 1  1  1   1      Review of Systems  Constitutional: Positive for activity change.  HENT: Positive for ear pain and sore throat. Negative for dental problem and trouble swallowing.   Respiratory: Positive for cough and shortness of breath.   Cardiovascular: Positive for chest pain. Negative for leg swelling.  Gastrointestinal: Positive for nausea, abdominal pain and constipation. Negative for vomiting and diarrhea.  Genitourinary: Positive for frequency and menstrual problem. Negative for vaginal bleeding and vaginal discharge.  Skin: Negative for rash.  Neurological: Positive for headaches.  All other systems reviewed and are negative.     Allergies  Ibuprofen and Penicillins  Home Medications   Prior to Admission medications   Medication Sig Start Date End Date Taking? Authorizing Provider  aspirin 81 MG tablet Take 81 mg by mouth daily.    Historical Provider, MD  celecoxib (CELEBREX) 200 MG capsule Take 200 mg by mouth 2 (two) times daily.    Historical Provider, MD  darifenacin (ENABLEX) 7.5 MG 24 hr tablet Take 7.5 mg by mouth daily.    Historical Provider, MD  gabapentin (NEURONTIN) 300 MG capsule Take 300 mg by mouth 3 (three) times daily.    Historical Provider, MD  metFORMIN (GLUCOPHAGE-XR) 500 MG 24 hr tablet Take 500 mg by mouth daily with breakfast.    Historical Provider, MD  metoprolol succinate (TOPROL-XL) 25 MG 24 hr tablet Take 25 mg by mouth daily.    Historical Provider, MD  solifenacin (VESICARE) 10 MG tablet Take 10 mg by mouth daily.    Historical Provider, MD   BP  154/67  Pulse 91  Temp(Src) 98.4 F (36.9 C) (Oral)  Resp 16  SpO2 100%  LMP 02/06/2014 Physical Exam  Nursing note and vitals reviewed. Constitutional: She appears well-developed and well-nourished. No distress.  Morbidly obese  HENT:  Head: Normocephalic and atraumatic.  Mouth/Throat: Oropharynx is clear and moist. No oropharyngeal exudate.  Neck: Neck supple.  Cardiovascular: Normal rate and regular rhythm.   Pulmonary/Chest: Effort normal and breath sounds normal. No respiratory distress. She has no wheezes. She has no rales.    Left lateral breast tenderness reproduces patient's chest wall/breast pain.  No palpable change in consistency of breast tissue - no fluctuance, no induration.  No overlying skin changes.   Abdominal: Soft. She exhibits no distension. There is no tenderness. There is no rebound and no guarding.  Musculoskeletal: She exhibits no edema.       Arms: Neurological: She is alert.  Moves all extremities equally. Sensation intact.  No gross deficits of cranial nerves.   Skin: She is not diaphoretic.  Psychiatric: Her mood appears anxious.    ED Course  Procedures (including critical care time) Labs Review Labs Reviewed  CBC - Abnormal; Notable for the following:    WBC 11.1 (*)    All other components within normal limits  BASIC METABOLIC PANEL - Abnormal; Notable for the following:    Glucose, Bld 114 (*)    GFR calc non Af Amer 85 (*)    All other components within normal limits  URINALYSIS, ROUTINE W REFLEX MICROSCOPIC - Abnormal; Notable for the following:    Hgb urine dipstick TRACE (*)    All other components within normal limits  CBG MONITORING, ED - Abnormal; Notable for the following:    Glucose-Capillary 105 (*)    All other components within normal limits  LIPASE, BLOOD  URINE MICROSCOPIC-ADD ON  POC URINE PREG, ED  Randolm Idol, ED    Imaging Review Dg Chest 2 View  02/12/2014   CLINICAL DATA:  Chest pain, nausea, weakness   EXAM: CHEST  2 VIEW  COMPARISON:  07/24/2013  FINDINGS: Lungs are clear.  No pleural effusion or pneumothorax.  The heart is normal in size.  Visualized osseous structures are within normal limits.  IMPRESSION: No evidence of acute cardiopulmonary disease.   Electronically Signed   By: Julian Hy M.D.   On: 02/12/2014 16:54     EKG Interpretation None       Date: 02/12/2014  Rate: 92  Rhythm: normal sinus rhythm  QRS Axis: left  Intervals: normal  ST/T Wave abnormalities: nonspecific T wave changes  Conduction Disutrbances:none  Narrative Interpretation:   Old EKG Reviewed: none available    Patient has had multiple new complaints throughout her ED stay.  I have reevaluated  her multiple times.  Pt has had c/o throat tightness - her oropharynx is clear.  She has no stridor, no wheezing.  She is tolerating her oral secretions.   6:01 PM Discussed patient with Dr Eulis Foster and have asked that he also see and examine the patient.    7:22 PM Pt has been seen and examined by Dr Eulis Foster.  Recommends valium for muscle spasm and bactrim for bronchitis.  Per Dr Eulis Foster, pt agreeable and ready for discharge.    MDM   Final diagnoses:  Bronchitis  Muscle spasm of back  Episodic tension-type headache, not intractable    Afebrile nontoxic patient with multiple complaints.  Many of her complaints including chest pain and headache are chronic and unchanged from her baseline.  She has been sick with URI symptoms x 1 week and has had 3 days of lightheadedness and nausea. Labs, UA, CXR unremarkable.  Pt does have headache that appears to be tension-type, has muscular tension and soreness of upper back.  Pt also seen by Dr Eulis Foster.  Please see above.  Pt d/c home with valium, bactrim, albuterol.  PCP follow up.  Discussed result, findings, treatment, and follow up  with patient.  Pt given return precautions.  Pt verbalizes understanding and agrees with plan.       Clayton Bibles, PA-C 02/12/14 2107

## 2014-02-12 NOTE — ED Provider Notes (Signed)
  Face-to-face evaluation   History: She complains of a tight sensation in her bilateral trapezius, with headache. The headache is improved with treatment in the emergency department. She also complains of cough, productive of sputum. She denies nasal congestion, or discharge. She has a mild sore throat. She denies fever, chills, nausea, vomiting. He has had left breast pain for several years. The breast. Pain is being evaluated with mammograms by the breast Center  Physical exam: Alert, obese female in mild distress. She is anxious. Heart regular rate and rhythm. No murmur. Lungs few scattered wheezes. No rhonchi or rales. Neck somewhat limited range of motion to flexion secondary to pain in trapezius, bilateral.  Medical screening examination/treatment/procedure(s) were conducted as a shared visit with non-physician practitioner(s) and myself.  I personally evaluated the patient during the encounter  Richarda Blade, MD 02/14/14 [phone]

## 2014-02-13 NOTE — ED Provider Notes (Signed)
Medical screening examination/treatment/procedure(s) were performed by non-physician practitioner and as supervising physician I was immediately available for consultation/collaboration.  Richarda Blade, MD 02/13/14 [phone]

## 2014-02-23 ENCOUNTER — Ambulatory Visit
Admission: RE | Admit: 2014-02-23 | Discharge: 2014-02-23 | Disposition: A | Discharge status: Home / Self Care | Payer: Medicaid Other | Source: Ambulatory Visit | Attending: Obstetrics & Gynecology | Admitting: Obstetrics & Gynecology

## 2014-02-23 DIAGNOSIS — R928 Other abnormal and inconclusive findings on diagnostic imaging of breast: Secondary | ICD-10-CM

## 2014-03-02 ENCOUNTER — Encounter (HOSPITAL_BASED_OUTPATIENT_CLINIC_OR_DEPARTMENT_OTHER): Payer: Self-pay

## 2014-03-09 ENCOUNTER — Ambulatory Visit: Payer: Medicaid Other | Admitting: Obstetrics & Gynecology

## 2014-03-16 ENCOUNTER — Ambulatory Visit
Admission: RE | Admit: 2014-03-16 | Discharge: 2014-03-16 | Disposition: A | Discharge status: Home / Self Care | Payer: Medicaid Other | Source: Ambulatory Visit | Attending: Cardiovascular Disease | Admitting: Cardiovascular Disease

## 2014-03-16 ENCOUNTER — Other Ambulatory Visit: Payer: Self-pay | Admitting: Cardiovascular Disease

## 2014-03-16 DIAGNOSIS — M25512 Pain in left shoulder: Secondary | ICD-10-CM

## 2014-03-16 DIAGNOSIS — M542 Cervicalgia: Secondary | ICD-10-CM

## 2014-03-16 DIAGNOSIS — M25511 Pain in right shoulder: Secondary | ICD-10-CM

## 2014-05-09 ENCOUNTER — Encounter (HOSPITAL_COMMUNITY): Payer: Self-pay | Admitting: Emergency Medicine

## 2014-06-06 ENCOUNTER — Emergency Department (HOSPITAL_COMMUNITY): Payer: Medicaid Other

## 2014-06-06 ENCOUNTER — Emergency Department (HOSPITAL_COMMUNITY)
Admission: EM | Admit: 2014-06-06 | Discharge: 2014-06-07 | Disposition: A | Discharge status: Home / Self Care | Payer: Medicaid Other | Attending: Emergency Medicine | Admitting: Emergency Medicine

## 2014-06-06 ENCOUNTER — Encounter (HOSPITAL_COMMUNITY): Payer: Self-pay | Admitting: Emergency Medicine

## 2014-06-06 DIAGNOSIS — Z791 Long term (current) use of non-steroidal anti-inflammatories (NSAID): Secondary | ICD-10-CM | POA: Insufficient documentation

## 2014-06-06 DIAGNOSIS — W1839XA Other fall on same level, initial encounter: Secondary | ICD-10-CM | POA: Diagnosis not present

## 2014-06-06 DIAGNOSIS — Z3202 Encounter for pregnancy test, result negative: Secondary | ICD-10-CM | POA: Insufficient documentation

## 2014-06-06 DIAGNOSIS — Z880 Allergy status to penicillin: Secondary | ICD-10-CM | POA: Insufficient documentation

## 2014-06-06 DIAGNOSIS — Y998 Other external cause status: Secondary | ICD-10-CM | POA: Diagnosis not present

## 2014-06-06 DIAGNOSIS — S29091A Other injury of muscle and tendon of front wall of thorax, initial encounter: Secondary | ICD-10-CM | POA: Diagnosis present

## 2014-06-06 DIAGNOSIS — E669 Obesity, unspecified: Secondary | ICD-10-CM | POA: Insufficient documentation

## 2014-06-06 DIAGNOSIS — Z87448 Personal history of other diseases of urinary system: Secondary | ICD-10-CM | POA: Diagnosis not present

## 2014-06-06 DIAGNOSIS — Y929 Unspecified place or not applicable: Secondary | ICD-10-CM | POA: Insufficient documentation

## 2014-06-06 DIAGNOSIS — Y939 Activity, unspecified: Secondary | ICD-10-CM | POA: Diagnosis not present

## 2014-06-06 DIAGNOSIS — Z79899 Other long term (current) drug therapy: Secondary | ICD-10-CM | POA: Diagnosis not present

## 2014-06-06 DIAGNOSIS — E119 Type 2 diabetes mellitus without complications: Secondary | ICD-10-CM | POA: Insufficient documentation

## 2014-06-06 DIAGNOSIS — Z7982 Long term (current) use of aspirin: Secondary | ICD-10-CM | POA: Diagnosis not present

## 2014-06-06 DIAGNOSIS — W19XXXA Unspecified fall, initial encounter: Secondary | ICD-10-CM

## 2014-06-06 DIAGNOSIS — Z8673 Personal history of transient ischemic attack (TIA), and cerebral infarction without residual deficits: Secondary | ICD-10-CM | POA: Diagnosis not present

## 2014-06-06 DIAGNOSIS — R0789 Other chest pain: Secondary | ICD-10-CM

## 2014-06-06 LAB — CBC
HCT: 35.9 % — ABNORMAL LOW (ref 36.0–46.0)
HEMOGLOBIN: 11.7 g/dL — AB (ref 12.0–15.0)
MCH: 28.3 pg (ref 26.0–34.0)
MCHC: 32.6 g/dL (ref 30.0–36.0)
MCV: 86.7 fL (ref 78.0–100.0)
Platelets: 330 10*3/uL (ref 150–400)
RBC: 4.14 MIL/uL (ref 3.87–5.11)
RDW: 13.4 % (ref 11.5–15.5)
WBC: 10.4 10*3/uL (ref 4.0–10.5)

## 2014-06-06 LAB — BASIC METABOLIC PANEL
Anion gap: 14 (ref 5–15)
BUN: 12 mg/dL (ref 6–23)
CO2: 23 mEq/L (ref 19–32)
Calcium: 9.7 mg/dL (ref 8.4–10.5)
Chloride: 103 mEq/L (ref 96–112)
Creatinine, Ser: 0.75 mg/dL (ref 0.50–1.10)
GFR calc non Af Amer: >90 mL/min (ref >90)
GLUCOSE: 129 mg/dL — AB (ref 70–99)
POTASSIUM: 4.2 meq/L (ref 3.7–5.3)
SODIUM: 140 meq/L (ref 137–147)

## 2014-06-06 LAB — I-STAT TROPONIN, ED
TROPONIN I, POC: 0 ng/mL (ref 0.00–0.08)
Troponin i, poc: 0 ng/mL (ref 0.00–0.08)

## 2014-06-06 LAB — PROTIME-INR
INR: 1.05 (ref 0.00–1.49)
Prothrombin Time: 13.8 seconds (ref 11.6–15.2)

## 2014-06-06 LAB — POC URINE PREG, ED: PREG TEST UR: NEGATIVE

## 2014-06-06 MED ORDER — HYDROCODONE-ACETAMINOPHEN 7.5-325 MG/15ML PO SOLN
10.0000 mL | Freq: Once | ORAL | Status: AC
  Administered 2014-06-06: 10 mL via ORAL
  Filled 2014-06-06: qty 15

## 2014-06-06 MED ORDER — SODIUM CHLORIDE 0.9 % IV SOLN
1000.0000 mL | INTRAVENOUS | Status: DC
  Administered 2014-06-06: 1000 mL via INTRAVENOUS

## 2014-06-06 NOTE — ED Notes (Signed)
Pt states that he chest and head started hurting earlier today. Took tylenol and flexeril w/o relief. Alert and oriented. Hx of tia.

## 2014-06-06 NOTE — ED Provider Notes (Signed)
CSN: [id number]     Arrival date & time 06/06/14  2030 History   First MD Initiated Contact with Patient 06/06/14 2110     Chief Complaint  Patient presents with  . Chest Pain   Patient is a 43 y.o. female presenting with chest pain. The history is provided by the patient.  Chest Pain Pain location:  L chest Pain quality: aching   Pain radiates to:  L arm Pain severity:  Severe Onset quality:  Sudden Duration:  5 hours Timing:  Constant Progression:  Worsening Chronicity:  New Relieved by:  Nothing Associated symptoms: no fever, no lower extremity edema, no shortness of breath and not vomiting   Associated symptoms comment:  Last night she had some chills and a cough  Risk factors: obesity   Risk factors: no birth control, no coronary artery disease, no diabetes mellitus, no hypertension and no prior DVT/PE     Past Medical History  Diagnosis Date  . Hypothyroidism   . Obesity   . Overactive bladder   . Nerve damage in leg  . Diabetes mellitus without complication boderline  . Stroke    Past Surgical History  Procedure Laterality Date  . Cesarean section     Family History  Problem Relation Age of Onset  . Ovarian cancer Mother     Ovarian Cancer  . Prostate cancer Father     Prostate Cancer  . Heart disease Other     Grandparent  . Diabetes Other     Parent   History  Substance Use Topics  . Smoking status: Never Smoker   . Smokeless tobacco: Never Used  . Alcohol Use: No   OB History    Gravida Para Term Preterm AB TAB SAB Ectopic Multiple Living   2 1 1  1  1   1      Review of Systems  Constitutional: Negative for fever.  Respiratory: Negative for shortness of breath.   Cardiovascular: Positive for chest pain.  Gastrointestinal: Negative for vomiting.  All other systems reviewed and are negative.     Allergies  Ibuprofen; Diazepam; Penicillins; and Contrast media  Home Medications   Prior to Admission medications   Medication Sig Start  Date End Date Taking? Authorizing Provider  acetaminophen (TYLENOL) 500 MG tablet Take 1,000 mg by mouth every 6 (six) hours as needed for mild pain or moderate pain.   Yes Historical Provider, MD  albuterol (PROVENTIL HFA;VENTOLIN HFA) 108 (90 BASE) MCG/ACT inhaler Inhale 1-2 puffs into the lungs every 6 (six) hours as needed for wheezing or shortness of breath. 02/12/14  Yes Clayton Bibles, PA-C  aspirin 325 MG tablet Take 325 mg by mouth daily.    Yes Historical Provider, MD  cholecalciferol (VITAMIN D) 1000 UNITS tablet Take 1,000 Units by mouth daily.   Yes Historical Provider, MD  cyclobenzaprine (FLEXERIL) 10 MG tablet Take 10 mg by mouth 3 times/day as needed-between meals & bedtime for muscle spasms.   Yes Historical Provider, MD  ferrous sulfate 325 (65 FE) MG tablet Take 325 mg by mouth daily with breakfast.   Yes Historical Provider, MD  gabapentin (NEURONTIN) 300 MG capsule Take 300 mg by mouth 3 (three) times daily.   Yes Historical Provider, MD  meloxicam (MOBIC) 15 MG tablet Take 15-30 mg by mouth 2 (two) times daily.   Yes Historical Provider, MD  solifenacin (VESICARE) 10 MG tablet Take 10 mg by mouth daily.   Yes Historical Provider, MD  diazepam (VALIUM) 5  MG tablet Take 1 tablet (5 mg total) by mouth every 8 (eight) hours as needed for muscle spasms. 02/12/14   Clayton Bibles, PA-C   BP 158/86 mmHg  Pulse 79  Temp(Src) 98.2 F (36.8 C) (Oral)  Resp 21  SpO2 99%  LMP 04/07/2014 Physical Exam  Constitutional: She appears well-developed and well-nourished. No distress.  HENT:  Head: Normocephalic and atraumatic.  Right Ear: External ear normal.  Left Ear: External ear normal.  Eyes: Conjunctivae are normal. Right eye exhibits no discharge. Left eye exhibits no discharge. No scleral icterus.  Neck: Neck supple. No tracheal deviation present.  Cardiovascular: Normal rate, regular rhythm and intact distal pulses.   Pulmonary/Chest: Effort normal and breath sounds normal. No stridor. No  respiratory distress. She has no wheezes. She has no rales. She exhibits tenderness (left chest wall, reproduces pain).  Abdominal: Soft. Bowel sounds are normal. She exhibits no distension. There is no tenderness. There is no rebound and no guarding.  Musculoskeletal: She exhibits no edema or tenderness.  Neurological: She is alert. She has normal strength. No cranial nerve deficit (no facial droop, extraocular movements intact, no slurred speech) or sensory deficit. She exhibits normal muscle tone. She displays no seizure activity. Coordination normal.  Skin: Skin is warm and dry. No rash noted.  Psychiatric: She has a normal mood and affect.  Nursing note and vitals reviewed.   ED Course  Procedures (including critical care time) Labs Review Labs Reviewed  CBC - Abnormal; Notable for the following:    Hemoglobin 11.7 (*)    HCT 35.9 (*)    All other components within normal limits  BASIC METABOLIC PANEL - Abnormal; Notable for the following:    Glucose, Bld 129 (*)    All other components within normal limits  PROTIME-INR  I-STAT TROPOININ, ED  POC URINE PREG, ED  I-STAT TROPOININ, ED  Randolm Idol, ED    Imaging Review Dg Chest 2 View  06/06/2014   CLINICAL DATA:  43 year old female with chest pain, cough and shortness of breath. Initial encounter.  EXAM: CHEST  2 VIEW  COMPARISON:  06/06/2014 and prior radiographs dating back to 09/28/2011  FINDINGS: The cardiomediastinal silhouette is unremarkable.  There is no evidence of focal airspace disease, pulmonary edema, suspicious pulmonary nodule/mass, pleural effusion, or pneumothorax. No acute bony abnormalities are identified.  IMPRESSION: No active cardiopulmonary disease.   Electronically Signed   By: Hassan Rowan M.D.   On: 06/06/2014 22:29   Dg Elbow Complete Left  06/06/2014   CLINICAL DATA:  Fall with left elbow injury and pain. Initial encounter.  EXAM: LEFT ELBOW - COMPLETE 3+ VIEW  COMPARISON:  None.  FINDINGS: There is  no evidence of fracture, dislocation, or joint effusion. There is no evidence of arthropathy or other focal bone abnormality. Soft tissues are unremarkable.  IMPRESSION: Negative.   Electronically Signed   By: Hassan Rowan M.D.   On: 06/06/2014 22:31   Dg Chest Port 1 View  06/06/2014   CLINICAL DATA:  Acute onset of left anterior chest pain. Status post fall while getting into bathtub. Shortness of breath. Initial encounter.  EXAM: PORTABLE CHEST - 1 VIEW  COMPARISON:  Chest radiograph performed 02/12/2014  FINDINGS: The lungs are well-aerated. Mild vascular congestion is noted. There is no evidence of focal opacification, pleural effusion or pneumothorax.  The cardiomediastinal silhouette is within normal limits. No acute osseous abnormalities are seen.  IMPRESSION: Mild vascular congestion noted; lungs remain grossly clear. No displaced  rib fracture seen.   Electronically Signed   By: Garald Balding M.D.   On: 06/06/2014 21:30     EKG Interpretation   Date/Time:  Monday June 06 2014 20:34:24 EST Ventricular Rate:  93 PR Interval:  156 QRS Duration: 101 QT Interval:  364 QTC Calculation: 453 R Axis:   -19 Text Interpretation:  Sinus rhythm Borderline left axis deviation Low  voltage, precordial leads Abnormal R-wave progression, late transition  Borderline T abnormalities, anterior leads Baseline wander in lead(s) II  aVR aVF V1 V3 V4 No significant change since last tracing Confirmed by  Jaydyn Bozzo  MD-J, Kemond Amorin ([id number]) on 06/06/2014 9:16:22 PM      MDM   Final diagnoses:  Fall  Chest wall pain    History of stress test in 2012.  Apical thinning noted.  Sees Dr Doylene Canard.  Pt notes that she has been having symptoms since a fall she had recently.  Today the symptoms were worse.  Chest wall ttp that reproduces her pain.   Cardiac enzymes negative times two.  Doubt acs.  At this time there does not appear to be any evidence of an acute emergency medical condition and the patient appears  stable for discharge with appropriate outpatient follow up.     Dorie Rank, MD 06/07/14 [phone]

## 2014-06-07 MED ORDER — OXYCODONE-ACETAMINOPHEN 5-325 MG PO TABS: 2.0000 | ORAL_TABLET | ORAL | Status: DC | PRN

## 2014-06-07 MED ORDER — HYDROCODONE-ACETAMINOPHEN 5-325 MG PO TABS: 1.0000 | ORAL_TABLET | ORAL | Status: DC | PRN

## 2014-06-07 NOTE — Discharge Instructions (Signed)

## 2014-06-09 ENCOUNTER — Ambulatory Visit (INDEPENDENT_AMBULATORY_CARE_PROVIDER_SITE_OTHER): Payer: Medicaid Other | Admitting: Obstetrics & Gynecology

## 2014-06-09 ENCOUNTER — Encounter: Payer: Self-pay | Admitting: Obstetrics & Gynecology

## 2014-06-09 VITALS — BP 123/81 | HR 91 | Temp 99.1°F | Ht 64.0 in | Wt 338.0 lb

## 2014-06-09 DIAGNOSIS — R0789 Other chest pain: Secondary | ICD-10-CM

## 2014-06-11 ENCOUNTER — Encounter: Payer: Self-pay | Admitting: Obstetrics & Gynecology

## 2014-06-11 NOTE — Progress Notes (Signed)
Patient ID: CHERYE GAERTNER, female   DOB: 16-Aug-1970, 43 y.o.   MRN: [id number]  Chief Complaint  Patient presents with  . Problem    Left Breast Pain and Lump under left arm.     HPI Jimmye L Weinert is a 43 y.o. female.  She reports a fall on her left side prior to the onset of the pain.  She also reports being recently fitted for a bra.  She denies nipple discharge, rash/skin changes, family h/o breast cancer.  She was diagnosed with a cyst in the right breast.  HPI  Past Medical History  Diagnosis Date  . Hypothyroidism   . Obesity   . Overactive bladder   . Nerve damage in leg  . Diabetes mellitus without complication boderline  . Stroke     Past Surgical History  Procedure Laterality Date  . Cesarean section      Family History  Problem Relation Age of Onset  . Ovarian cancer Mother     Ovarian Cancer  . Prostate cancer Father     Prostate Cancer  . Heart disease Other     Grandparent  . Diabetes Other     Parent    Social History History  Substance Use Topics  . Smoking status: Never Smoker   . Smokeless tobacco: Never Used  . Alcohol Use: No    Allergies  Allergen Reactions  . Ibuprofen Anaphylaxis and Swelling    Cant breath   . Diazepam Other (See Comments)    Hallucinations   . Penicillins Itching  . Contrast Media [Iodinated Diagnostic Agents] Itching and Rash    Current Outpatient Prescriptions  Medication Sig Dispense Refill  . acetaminophen (TYLENOL) 500 MG tablet Take 1,000 mg by mouth every 6 (six) hours as needed for mild pain or moderate pain.    Marland Kitchen albuterol (PROVENTIL HFA;VENTOLIN HFA) 108 (90 BASE) MCG/ACT inhaler Inhale 1-2 puffs into the lungs every 6 (six) hours as needed for wheezing or shortness of breath. 1 Inhaler 0  . aspirin 325 MG tablet Take 325 mg by mouth daily.     . cholecalciferol (VITAMIN D) 1000 UNITS tablet Take 1,000 Units by mouth daily.    . cyclobenzaprine (FLEXERIL) 10 MG tablet Take 10 mg by mouth 3 times/day as  needed-between meals & bedtime for muscle spasms.    . ferrous sulfate 325 (65 FE) MG tablet Take 325 mg by mouth daily with breakfast.    . gabapentin (NEURONTIN) 300 MG capsule Take 300 mg by mouth 3 (three) times daily.    . meloxicam (MOBIC) 15 MG tablet Take 15-30 mg by mouth 2 (two) times daily.    Marland Kitchen oxyCODONE-acetaminophen (PERCOCET/ROXICET) 5-325 MG per tablet Take 2 tablets by mouth every 4 (four) hours as needed for severe pain. 20 tablet 0  . solifenacin (VESICARE) 10 MG tablet Take 10 mg by mouth daily.     No current facility-administered medications for this visit.    Review of Systems Review of Systems Constitutional: negative for fatigue and weight loss Respiratory: negative for cough and wheezing Cardiovascular: negative for chest pain, fatigue and palpitations Gastrointestinal: negative for abdominal pain and change in bowel habits Genitourinary:negative for abnormal vaginal discharge Integument/breast: negative for nipple discharge Musculoskeletal:negative for myalgias Neurological: negative for gait problems and tremors Behavioral/Psych: negative for abusive relationship, depression Endocrine: negative for temperature intolerance     Blood pressure 123/81, pulse 91, temperature 99.1 F (37.3 C), height 5\' 4"  (1.626 m), weight 153.316 kg (338  lb), last menstrual period 04/07/2014.  Physical Exam Physical Exam General:   alert  Skin:   no rash or abnormalities  Lungs:   clear to auscultation bilaterally  Heart:   regular rate and rhythm, S1, S2 normal, no murmur, click, rub or gallop  Breasts:   normal without suspicious masses, skin or nipple changes or axillary nodes; tenderness over the chest wall      Data Reviewed MMG  Assessment    ?Etiology of complaints--likely musculoskeletal ie.,  Chest wall syndrome, trauma; ddx--mastodynia--fibrocystic breast d/o     Plan    Supportive measures Follow up as needed or in 2 mths          JACKSON-MOORE,Launa Goedken A 06/11/2014, 4:36 PM

## 2014-06-11 NOTE — Patient Instructions (Signed)

## 2014-06-24 ENCOUNTER — Inpatient Hospital Stay (HOSPITAL_COMMUNITY)
Admission: AD | Admit: 2014-06-24 | Discharge: 2014-06-27 | DRG: 287 | Disposition: A | Discharge status: Home / Self Care | Payer: Medicaid Other | Source: Ambulatory Visit | Attending: Cardiovascular Disease | Admitting: Cardiovascular Disease

## 2014-06-24 ENCOUNTER — Encounter (HOSPITAL_COMMUNITY): Payer: Self-pay | Admitting: General Practice

## 2014-06-24 DIAGNOSIS — D649 Anemia, unspecified: Secondary | ICD-10-CM | POA: Diagnosis present

## 2014-06-24 DIAGNOSIS — M549 Dorsalgia, unspecified: Secondary | ICD-10-CM | POA: Diagnosis present

## 2014-06-24 DIAGNOSIS — G8929 Other chronic pain: Secondary | ICD-10-CM | POA: Diagnosis present

## 2014-06-24 DIAGNOSIS — M542 Cervicalgia: Secondary | ICD-10-CM | POA: Diagnosis present

## 2014-06-24 DIAGNOSIS — Z79899 Other long term (current) drug therapy: Secondary | ICD-10-CM

## 2014-06-24 DIAGNOSIS — R079 Chest pain, unspecified: Principal | ICD-10-CM | POA: Diagnosis present

## 2014-06-24 DIAGNOSIS — E039 Hypothyroidism, unspecified: Secondary | ICD-10-CM | POA: Diagnosis present

## 2014-06-24 DIAGNOSIS — Z8673 Personal history of transient ischemic attack (TIA), and cerebral infarction without residual deficits: Secondary | ICD-10-CM | POA: Diagnosis not present

## 2014-06-24 DIAGNOSIS — Z7982 Long term (current) use of aspirin: Secondary | ICD-10-CM | POA: Diagnosis not present

## 2014-06-24 DIAGNOSIS — Z6841 Body Mass Index (BMI) 40.0 and over, adult: Secondary | ICD-10-CM | POA: Diagnosis not present

## 2014-06-24 DIAGNOSIS — E119 Type 2 diabetes mellitus without complications: Secondary | ICD-10-CM | POA: Diagnosis not present

## 2014-06-24 HISTORY — DX: Unspecified asthma, uncomplicated: J45.909

## 2014-06-24 HISTORY — DX: Migraine, unspecified, not intractable, without status migrainosus: G43.909

## 2014-06-24 HISTORY — DX: Unspecified osteoarthritis, unspecified site: M19.90

## 2014-06-24 HISTORY — DX: Transient cerebral ischemic attack, unspecified: G45.9

## 2014-06-24 HISTORY — DX: Unspecified chronic bronchitis: J42

## 2014-06-24 HISTORY — DX: Pneumonia, unspecified organism: J18.9

## 2014-06-24 HISTORY — DX: Dependence on other enabling machines and devices: Z99.89

## 2014-06-24 HISTORY — DX: Prediabetes: R73.03

## 2014-06-24 HISTORY — DX: Obstructive sleep apnea (adult) (pediatric): G47.33

## 2014-06-24 HISTORY — DX: Anemia, unspecified: D64.9

## 2014-06-24 LAB — CBC WITH DIFFERENTIAL/PLATELET
BASOS ABS: 0 10*3/uL (ref 0.0–0.1)
BASOS PCT: 0 % (ref 0–1)
Eosinophils Absolute: 0.1 10*3/uL (ref 0.0–0.7)
Eosinophils Relative: 1 % (ref 0–5)
HCT: 34.3 % — ABNORMAL LOW (ref 36.0–46.0)
Hemoglobin: 11.2 g/dL — ABNORMAL LOW (ref 12.0–15.0)
LYMPHS PCT: 34 % (ref 12–46)
Lymphs Abs: 4.1 10*3/uL — ABNORMAL HIGH (ref 0.7–4.0)
MCH: 28.4 pg (ref 26.0–34.0)
MCHC: 32.7 g/dL (ref 30.0–36.0)
MCV: 87.1 fL (ref 78.0–100.0)
Monocytes Absolute: 0.5 10*3/uL (ref 0.1–1.0)
Monocytes Relative: 4 % (ref 3–12)
NEUTROS ABS: 7.5 10*3/uL (ref 1.7–7.7)
Neutrophils Relative %: 61 % (ref 43–77)
PLATELETS: 287 10*3/uL (ref 150–400)
RBC: 3.94 MIL/uL (ref 3.87–5.11)
RDW: 13.6 % (ref 11.5–15.5)
WBC: 12.1 10*3/uL — AB (ref 4.0–10.5)

## 2014-06-24 LAB — BASIC METABOLIC PANEL
ANION GAP: 14 (ref 5–15)
BUN: 12 mg/dL (ref 6–23)
CALCIUM: 9.4 mg/dL (ref 8.4–10.5)
CO2: 23 mEq/L (ref 19–32)
CREATININE: 0.7 mg/dL (ref 0.50–1.10)
Chloride: 102 mEq/L (ref 96–112)
Glucose, Bld: 84 mg/dL (ref 70–99)
Potassium: 3.6 mEq/L — ABNORMAL LOW (ref 3.7–5.3)
SODIUM: 139 meq/L (ref 137–147)

## 2014-06-24 LAB — HEPARIN LEVEL (UNFRACTIONATED): HEPARIN UNFRACTIONATED: 0.41 [IU]/mL (ref 0.30–0.70)

## 2014-06-24 LAB — TROPONIN I

## 2014-06-24 MED ORDER — NITROGLYCERIN 0.4 MG SL SUBL
0.4000 mg | SUBLINGUAL_TABLET | SUBLINGUAL | Status: DC | PRN
Start: 2014-06-24 — End: 2014-06-27

## 2014-06-24 MED ORDER — SODIUM CHLORIDE 0.9 % IJ SOLN: 3.0000 mL | Freq: Two times a day (BID) | INTRAMUSCULAR | Status: DC

## 2014-06-24 MED ORDER — POTASSIUM CHLORIDE ER 10 MEQ PO TBCR
10.0000 meq | EXTENDED_RELEASE_TABLET | Freq: Every day | ORAL | Status: DC
  Administered 2014-06-24 – 2014-06-27 (×4): 10 meq via ORAL
  Filled 2014-06-24 (×8): qty 1

## 2014-06-24 MED ORDER — CYCLOBENZAPRINE HCL 10 MG PO TABS
10.0000 mg | ORAL_TABLET | Freq: Every day | ORAL | Status: DC
  Administered 2014-06-24 – 2014-06-26 (×3): 10 mg via ORAL
  Filled 2014-06-24 (×4): qty 1

## 2014-06-24 MED ORDER — FERROUS SULFATE 325 (65 FE) MG PO TABS
325.0000 mg | ORAL_TABLET | Freq: Every day | ORAL | Status: DC
  Administered 2014-06-25 – 2014-06-27 (×3): 325 mg via ORAL
  Filled 2014-06-24 (×3): qty 1

## 2014-06-24 MED ORDER — SODIUM CHLORIDE 0.9 % IJ SOLN: 3.0000 mL | INTRAMUSCULAR | Status: DC | PRN

## 2014-06-24 MED ORDER — ASPIRIN EC 81 MG PO TBEC
81.0000 mg | DELAYED_RELEASE_TABLET | Freq: Every day | ORAL | Status: DC
  Administered 2014-06-25 – 2014-06-27 (×3): 81 mg via ORAL
  Filled 2014-06-24 (×3): qty 1

## 2014-06-24 MED ORDER — METOPROLOL TARTRATE 12.5 MG HALF TABLET
12.5000 mg | ORAL_TABLET | Freq: Two times a day (BID) | ORAL | Status: DC
Start: 2014-06-24 — End: 2014-06-27
  Administered 2014-06-24 – 2014-06-27 (×6): 12.5 mg via ORAL
  Filled 2014-06-24 (×6): qty 1

## 2014-06-24 MED ORDER — GABAPENTIN 300 MG PO CAPS
300.0000 mg | ORAL_CAPSULE | Freq: Two times a day (BID) | ORAL | Status: DC
  Administered 2014-06-24 – 2014-06-27 (×6): 300 mg via ORAL
  Filled 2014-06-24 (×6): qty 1

## 2014-06-24 MED ORDER — DARIFENACIN HYDROBROMIDE ER 7.5 MG PO TB24
7.5000 mg | ORAL_TABLET | Freq: Every day | ORAL | Status: DC
  Administered 2014-06-25 – 2014-06-27 (×3): 7.5 mg via ORAL
  Filled 2014-06-24 (×3): qty 1

## 2014-06-24 MED ORDER — ADULT MULTIVITAMIN W/MINERALS CH
1.0000 | ORAL_TABLET | Freq: Every day | ORAL | Status: DC
  Administered 2014-06-24 – 2014-06-27 (×4): 1 via ORAL
  Filled 2014-06-24 (×4): qty 1

## 2014-06-24 MED ORDER — ALBUTEROL SULFATE (2.5 MG/3ML) 0.083% IN NEBU: 3.0000 mL | INHALATION_SOLUTION | Freq: Four times a day (QID) | RESPIRATORY_TRACT | Status: DC | PRN

## 2014-06-24 MED ORDER — SODIUM CHLORIDE 0.9 % IV SOLN: 250.0000 mL | INTRAVENOUS | Status: DC | PRN

## 2014-06-24 MED ORDER — HEPARIN BOLUS VIA INFUSION
4000.0000 [IU] | Freq: Once | INTRAVENOUS | Status: AC
  Administered 2014-06-24: 4000 [IU] via INTRAVENOUS
  Filled 2014-06-24: qty 4000

## 2014-06-24 MED ORDER — ACETAMINOPHEN 325 MG PO TABS
650.0000 mg | ORAL_TABLET | ORAL | Status: DC | PRN
  Administered 2014-06-25: 650 mg via ORAL
  Filled 2014-06-24: qty 2

## 2014-06-24 MED ORDER — AMITRIPTYLINE HCL 10 MG PO TABS
10.0000 mg | ORAL_TABLET | Freq: Every evening | ORAL | Status: DC | PRN
  Administered 2014-06-25 (×2): 10 mg via ORAL
  Filled 2014-06-24 (×5): qty 1

## 2014-06-24 MED ORDER — ATORVASTATIN CALCIUM 40 MG PO TABS
40.0000 mg | ORAL_TABLET | Freq: Every day | ORAL | Status: DC
  Administered 2014-06-24 – 2014-06-27 (×4): 40 mg via ORAL
  Filled 2014-06-24 (×4): qty 1

## 2014-06-24 MED ORDER — ONDANSETRON HCL 4 MG/2ML IJ SOLN: 4.0000 mg | Freq: Four times a day (QID) | INTRAMUSCULAR | Status: DC | PRN

## 2014-06-24 MED ORDER — HEPARIN (PORCINE) IN NACL 100-0.45 UNIT/ML-% IJ SOLN
1300.0000 [IU]/h | INTRAMUSCULAR | Status: DC
  Administered 2014-06-24 – 2014-06-27 (×4): 1300 [IU]/h via INTRAVENOUS
  Filled 2014-06-24 (×4): qty 250

## 2014-06-24 NOTE — H&P (Signed)
Referring Physician:  [name] is an 43 y.o. female.                       Chief Complaint: Chest pain  HPI: 43 year old female with a history of obesity and diabetes presents to office with a chief complaint of left sided chest pain that radiates to left shoulder & lasts for long time and intermittent in nature. Patient describes the pain as a heavy squeezing sensation. Associated symptoms include shortness of breath and dyspnea on exertion. No fever or cough.  Past Medical History  Diagnosis Date  . Hypothyroidism   . Obesity   . Overactive bladder   . Nerve damage in leg  . Diabetes mellitus without complication boderline  . Stroke       Past Surgical History  Procedure Laterality Date  . Cesarean section      Family History  Problem Relation Age of Onset  . Ovarian cancer Mother     Ovarian Cancer  . Prostate cancer Father     Prostate Cancer  . Heart disease Other     Grandparent  . Diabetes Other     Parent   Social History:  reports that she has never smoked. She has never used smokeless tobacco. She reports that she does not drink alcohol or use illicit drugs.  Allergies:  Allergies  Allergen Reactions  . Ibuprofen Anaphylaxis and Swelling  . Diazepam Other (See Comments)    Hallucinations   . Hydrocodone Other (See Comments)    hallucinations  . Penicillins Itching  . Contrast Media [Iodinated Diagnostic Agents] Itching and Rash    Medications Prior to Admission  Medication Sig Dispense Refill  . acetaminophen (TYLENOL) 500 MG tablet Take 1,000 mg by mouth every 6 (six) hours as needed (menstrual cramps).     Marland Kitchen albuterol (PROVENTIL HFA;VENTOLIN HFA) 108 (90 BASE) MCG/ACT inhaler Inhale 1-2 puffs into the lungs every 6 (six) hours as needed for wheezing or shortness of breath. 1 Inhaler 0  . amitriptyline (ELAVIL) 10 MG tablet Take 10 mg by mouth at bedtime as needed for sleep (migraines).     . Ascorbic Acid (VITAMIN C PO) Take 2 tablets by mouth  daily.    Marland Kitchen aspirin EC 81 MG tablet Take 81 mg by mouth daily.    . cholecalciferol (VITAMIN D) 1000 UNITS tablet Take 1,000 Units by mouth daily.    . cyclobenzaprine (FLEXERIL) 10 MG tablet Take 10 mg by mouth at bedtime.     . ferrous sulfate 325 (65 FE) MG tablet Take 325 mg by mouth daily with breakfast.    . gabapentin (NEURONTIN) 300 MG capsule Take 300 mg by mouth 2 (two) times daily.     . meloxicam (MOBIC) 15 MG tablet Take 15 mg by mouth 2 (two) times daily.     . Multiple Vitamin (MULTIVITAMIN WITH MINERALS) TABS tablet Take 1 tablet by mouth daily. Centrum    . oxyCODONE-acetaminophen (PERCOCET/ROXICET) 5-325 MG per tablet Take 2 tablets by mouth every 4 (four) hours as needed for severe pain. (Patient taking differently: Take 1 tablet by mouth every 4 (four) hours as needed for severe pain. ) 20 tablet 0  . solifenacin (VESICARE) 10 MG tablet Take 10 mg by mouth daily.      No results found for this or any previous visit (from the past 48 hour(s)). No results found.  Review Of Systems Constitutional: Negative for fever and chills.  Eyes: Negative for double vision and photophobia.  Respiratory: Negative for cough, hemoptysis and sputum production.  Cardiovascular: Positive for chest pain and leg swelling. Negative for orthopnea and claudication.  Gastrointestinal: Negative for nausea, vomiting and abdominal pain.  Genitourinary: Negative for dysuria.  Neurological: Positive for dizziness and headaches  Blood pressure 149/86, pulse 94, temperature 98.4 F (36.9 C), temperature source Oral, resp. rate 18, height 5\' 4"  (1.626 m), weight 153.27 kg (337 lb 14.4 oz), last menstrual period 04/07/2014, SpO2 97 %. Physical Exam: Constitutional: She appears well-developed and well-nourished. No distress. Morbidly obese  HENT: Normocephalic and atraumatic. Brown eyes, Conjunctivae and EOM are normal. Pupils are equal, round, and reactive to light.  Neck: Normal range of  motion. Neck supple. Normal carotid pulses and no JVD present. Carotid bruit is not present. No rigidity. Normal range of motion present.  Cardiovascular: Normal rate, regular rhythm, S1 normal, S2 normal, normal heart sounds, intact distal pulses and normal pulses. Exam reveals no gallop and no friction rub.No murmur heard. Pulmonary/Chest: Effort normal and breath sounds normal. No accessory muscle usage or stridor. No respiratory distress. She exhibits left parasternal tenderness.  Abdominal: Bowel sounds are normal. Soft non tender. Non pulsatile aorta.  Skin: Skin is warm, dry and intact. No rash noted. She is not diaphoretic. No cyanosis. Nails show no clubbing.  Assessment/Plan Chest pain at rest Obesity, morbid H/O hyperglycemia Chronic back pain Chronic neck pain  Admit/r/o MI.  Birdie Riddle, MD  06/24/2014, 5:36 PM

## 2014-06-24 NOTE — Progress Notes (Signed)
ANTICOAGULATION CONSULT NOTE - Initial Consult  Pharmacy Consult for Heparin Indication: chest pain/ACS  Allergies  Allergen Reactions  . Ibuprofen Anaphylaxis and Swelling  . Diazepam Other (See Comments)    Hallucinations   . Hydrocodone Other (See Comments)    hallucinations  . Penicillins Itching  . Contrast Media [Iodinated Diagnostic Agents] Itching and Rash    Patient Measurements: Height: 5\' 4"  (162.6 cm) Weight: (!) 337 lb 14.4 oz (153.27 kg) IBW/kg (Calculated) : 54.7 Heparin Dosing Weight: 94 kg  Vital Signs: Temp: 98.4 F (36.9 C) (12/18 1632) Temp Source: Oral (12/18 1632) BP: 149/86 mmHg (12/18 1632) Pulse Rate: 94 (12/18 1632)  Labs: No results for input(s): HGB, HCT, PLT, APTT, LABPROT, INR, HEPARINUNFRC, CREATININE, CKTOTAL, CKMB, TROPONINI in the last 72 hours.  Estimated Creatinine Clearance: 131.9 mL/min (by C-G formula based on Cr of 0.75).   Medical History: Past Medical History  Diagnosis Date  . Hypothyroidism   . Obesity   . Overactive bladder   . Nerve damage in leg  . Diabetes mellitus without complication boderline  . Stroke     Medications:  Prescriptions prior to admission  Medication Sig Dispense Refill Last Dose  . acetaminophen (TYLENOL) 500 MG tablet Take 1,000 mg by mouth every 6 (six) hours as needed (menstrual cramps).    2 months ago  . albuterol (PROVENTIL HFA;VENTOLIN HFA) 108 (90 BASE) MCG/ACT inhaler Inhale 1-2 puffs into the lungs every 6 (six) hours as needed for wheezing or shortness of breath. 1 Inhaler 0 several months ago  . amitriptyline (ELAVIL) 10 MG tablet Take 10 mg by mouth at bedtime as needed for sleep (migraines).    4 weeks ago  . Ascorbic Acid (VITAMIN C PO) Take 2 tablets by mouth daily.   06/24/2014 at Unknown time  . aspirin EC 81 MG tablet Take 81 mg by mouth daily.   06/24/2014 at Unknown time  . cholecalciferol (VITAMIN D) 1000 UNITS tablet Take 1,000 Units by mouth daily.   06/23/2014 at Unknown  time  . cyclobenzaprine (FLEXERIL) 10 MG tablet Take 10 mg by mouth at bedtime.    06/23/2014 at Unknown time  . ferrous sulfate 325 (65 FE) MG tablet Take 325 mg by mouth daily with breakfast.   2 weeks ago  . gabapentin (NEURONTIN) 300 MG capsule Take 300 mg by mouth 2 (two) times daily.    06/23/2014 at Unknown time  . meloxicam (MOBIC) 15 MG tablet Take 15 mg by mouth 2 (two) times daily.    06/24/2014 at am  . Multiple Vitamin (MULTIVITAMIN WITH MINERALS) TABS tablet Take 1 tablet by mouth daily. Centrum   2 weeks ago  . oxyCODONE-acetaminophen (PERCOCET/ROXICET) 5-325 MG per tablet Take 2 tablets by mouth every 4 (four) hours as needed for severe pain. (Patient taking differently: Take 1 tablet by mouth every 4 (four) hours as needed for severe pain. ) 20 tablet 0 week ago  . solifenacin (VESICARE) 10 MG tablet Take 10 mg by mouth daily.   3 weeks ago    Assessment: 43 year old morbidly obese female to start IV heparin for ACS.   Anticoagulation: Baseline labs pending. Last SCr on 06/06/14 was 0.75 and CBC was within normal limits. No anticoagulation prior to admission.   Goal of Therapy:  Heparin level 0.3-0.7 units/ml Monitor platelets by anticoagulation protocol: Yes   Plan:  Heparin bolus of 4000 units, then heparin drip at 1300 units/hr.  Heparin level in 6 hours. Daily heparin level  and CBC.   Sloan Leiter, PharmD, BCPS Clinical Pharmacist [phone] 06/24/2014,5:22 PM

## 2014-06-25 ENCOUNTER — Inpatient Hospital Stay (HOSPITAL_COMMUNITY): Payer: Medicaid Other

## 2014-06-25 DIAGNOSIS — I517 Cardiomegaly: Secondary | ICD-10-CM

## 2014-06-25 LAB — CBC
HEMATOCRIT: 33.3 % — AB (ref 36.0–46.0)
Hemoglobin: 10.7 g/dL — ABNORMAL LOW (ref 12.0–15.0)
MCH: 28.2 pg (ref 26.0–34.0)
MCHC: 32.1 g/dL (ref 30.0–36.0)
MCV: 87.6 fL (ref 78.0–100.0)
PLATELETS: 303 10*3/uL (ref 150–400)
RBC: 3.8 MIL/uL — AB (ref 3.87–5.11)
RDW: 13.8 % (ref 11.5–15.5)
WBC: 7.9 10*3/uL (ref 4.0–10.5)

## 2014-06-25 LAB — LIPID PANEL
Cholesterol: 138 mg/dL (ref 0–200)
HDL: 31 mg/dL — AB (ref >39)
LDL Cholesterol: 84 mg/dL (ref 0–99)
Total CHOL/HDL Ratio: 4.5 RATIO
Triglycerides: 114 mg/dL (ref <150)
VLDL: 23 mg/dL (ref 0–40)

## 2014-06-25 LAB — BASIC METABOLIC PANEL
ANION GAP: 10 (ref 5–15)
BUN: 11 mg/dL (ref 6–23)
CALCIUM: 9.3 mg/dL (ref 8.4–10.5)
CHLORIDE: 104 meq/L (ref 96–112)
CO2: 27 mEq/L (ref 19–32)
Creatinine, Ser: 0.76 mg/dL (ref 0.50–1.10)
GFR calc Af Amer: >90 mL/min (ref >90)
GFR calc non Af Amer: >90 mL/min (ref >90)
Glucose, Bld: 96 mg/dL (ref 70–99)
Potassium: 4.1 mEq/L (ref 3.7–5.3)
Sodium: 141 mEq/L (ref 137–147)

## 2014-06-25 LAB — HEPARIN LEVEL (UNFRACTIONATED): HEPARIN UNFRACTIONATED: 0.36 [IU]/mL (ref 0.30–0.70)

## 2014-06-25 LAB — TROPONIN I

## 2014-06-25 LAB — PROTIME-INR
INR: 1.04 (ref 0.00–1.49)
Prothrombin Time: 13.7 seconds (ref 11.6–15.2)

## 2014-06-25 MED ORDER — REGADENOSON 0.4 MG/5ML IV SOLN
0.4000 mg | Freq: Once | INTRAVENOUS | Status: AC
  Administered 2014-06-25: 0.4 mg via INTRAVENOUS
  Filled 2014-06-25: qty 5

## 2014-06-25 MED ORDER — REGADENOSON 0.4 MG/5ML IV SOLN
INTRAVENOUS | Status: AC
  Filled 2014-06-25: qty 5

## 2014-06-25 MED ORDER — TECHNETIUM TC 99M SESTAMIBI GENERIC - CARDIOLITE
30.0000 | Freq: Once | INTRAVENOUS | Status: AC | PRN
  Administered 2014-06-25: 30 via INTRAVENOUS

## 2014-06-25 MED ORDER — PERFLUTREN LIPID MICROSPHERE
1.0000 mL | INTRAVENOUS | Status: AC | PRN
  Administered 2014-06-25: 1 mL via INTRAVENOUS
  Filled 2014-06-25: qty 10

## 2014-06-25 NOTE — Progress Notes (Signed)
ANTICOAGULATION CONSULT NOTE - Follow Up Consult  Pharmacy Consult for Heparin Indication: chest pain/ACS  Allergies  Allergen Reactions  . Ibuprofen Anaphylaxis and Swelling  . Diazepam Other (See Comments)    Hallucinations   . Hydrocodone Other (See Comments)    hallucinations  . Penicillins Itching  . Contrast Media [Iodinated Diagnostic Agents] Itching and Rash    Patient Measurements: Height: 5\' 4"  (162.6 cm) Weight: (!) 337 lb 11.9 oz (153.2 kg) IBW/kg (Calculated) : 54.7 Heparin Dosing Weight: 94 kg  Vital Signs: Temp: 97.9 F (36.6 C) (12/19 0623) Temp Source: Axillary (12/19 0623) BP: 120/56 mmHg (12/19 0623) Pulse Rate: 88 (12/19 0623)  Labs:  Recent Labs  06/24/14 1933 06/24/14 2315 06/25/14 0305 06/25/14 0745  HGB 11.2*  --   --  10.7*  HCT 34.3*  --   --  33.3*  PLT 287  --   --  303  LABPROT  --   --   --  13.7  INR  --   --   --  1.04  HEPARINUNFRC  --  0.41 0.36  --   CREATININE 0.70  --   --  0.76  TROPONINI <0.30 <0.30  --  <0.30    Estimated Creatinine Clearance: 131.9 mL/min (by C-G formula based on Cr of 0.76).   Medications:  Scheduled:  . aspirin EC  81 mg Oral Daily  . atorvastatin  40 mg Oral q1800  . cyclobenzaprine  10 mg Oral QHS  . darifenacin  7.5 mg Oral Daily  . ferrous sulfate  325 mg Oral Q breakfast  . gabapentin  300 mg Oral BID  . metoprolol tartrate  12.5 mg Oral BID  . multivitamin with minerals  1 tablet Oral Daily  . potassium chloride  10 mEq Oral Daily  . regadenoson      . regadenoson  0.4 mg Intravenous Once  . sodium chloride  3 mL Intravenous Q12H   Infusions:  . heparin 1,300 Units/hr (06/24/14 1916)    Assessment: CC: Peggy Avery is an 43 y.o. female with history of obesity and DM admitted on 06/24/2014 presenting with CP.  Recent fall on left side prior to onset of pain.  Pharmacy was consulted to dose Heparin for chest pain and r/o ACS.    Heparin level therapeutic at 0.36.  No bleeding  reported.   H/H low, plt wnl. Renal function stable.  CrCl >100, Lytes wnl.   Goal of Therapy:  Heparin level 0.3-0.7 units/ml Monitor platelets by anticoagulation protocol: Yes   Plan:  - Continue heparin drip at 1300 units/hr.  - Daily heparin level and CBC.  - Monitor for signs and symptoms of bleeding - F/U Stress test  Hassie Bruce, Pharm. D. Clinical Pharmacy Resident Pager: [phone] Ph: [phone] 06/25/2014 10:53 AM

## 2014-06-25 NOTE — Progress Notes (Signed)
ANTICOAGULATION CONSULT NOTE - Follow Up Consult  Pharmacy Consult for heparin Indication: chest pain/ACS   Labs:  Recent Labs  06/24/14 1933 06/24/14 2315  HGB 11.2*  --   HCT 34.3*  --   PLT 287  --   HEPARINUNFRC  --  0.41  CREATININE 0.70  --   TROPONINI <0.30  --      Assessment/Plan:  43yo female therapeutic on heparin with initial dosing for CP. Will continue gtt at current rate and confirm stable with am labs.   Wynona Neat, PharmD, BCPS  06/25/2014,12:09 AM

## 2014-06-25 NOTE — Progress Notes (Signed)
  Echocardiogram 2D Echocardiogram has been performed.  Lysle Rubens 06/25/2014, 3:46 PM

## 2014-06-26 ENCOUNTER — Inpatient Hospital Stay (HOSPITAL_COMMUNITY): Payer: Medicaid Other

## 2014-06-26 LAB — CBC
HCT: 32.9 % — ABNORMAL LOW (ref 36.0–46.0)
HCT: 34.6 % — ABNORMAL LOW (ref 36.0–46.0)
Hemoglobin: 10.7 g/dL — ABNORMAL LOW (ref 12.0–15.0)
Hemoglobin: 10.9 g/dL — ABNORMAL LOW (ref 12.0–15.0)
MCH: 29.1 pg (ref 26.0–34.0)
MCH: 27.7 pg (ref 26.0–34.0)
MCHC: 32.5 g/dL (ref 30.0–36.0)
MCHC: 31.5 g/dL (ref 30.0–36.0)
MCV: 89.4 fL (ref 78.0–100.0)
MCV: 87.8 fL (ref 78.0–100.0)
PLATELETS: 285 10*3/uL (ref 150–400)
Platelets: 302 10*3/uL (ref 150–400)
RBC: 3.68 MIL/uL — ABNORMAL LOW (ref 3.87–5.11)
RBC: 3.94 MIL/uL (ref 3.87–5.11)
RDW: 13.8 % (ref 11.5–15.5)
RDW: 13.8 % (ref 11.5–15.5)
WBC: 9.2 10*3/uL (ref 4.0–10.5)
WBC: 9.6 10*3/uL (ref 4.0–10.5)

## 2014-06-26 LAB — BASIC METABOLIC PANEL
Anion gap: 10 (ref 5–15)
BUN: 10 mg/dL (ref 6–23)
CALCIUM: 9.5 mg/dL (ref 8.4–10.5)
CO2: 25 mEq/L (ref 19–32)
Chloride: 102 mEq/L (ref 96–112)
Creatinine, Ser: 0.85 mg/dL (ref 0.50–1.10)
GFR calc Af Amer: >90 mL/min (ref >90)
GFR calc non Af Amer: 82 mL/min — ABNORMAL LOW (ref >90)
GLUCOSE: 88 mg/dL (ref 70–99)
Potassium: 4.3 mEq/L (ref 3.7–5.3)
SODIUM: 137 meq/L (ref 137–147)

## 2014-06-26 LAB — FERRITIN: FERRITIN: 33 ng/mL (ref 10–291)

## 2014-06-26 LAB — IRON AND TIBC
IRON: 69 ug/dL (ref 42–135)
SATURATION RATIOS: 20 % (ref 20–55)
TIBC: 342 ug/dL (ref 250–470)
UIBC: 273 ug/dL (ref 125–400)

## 2014-06-26 LAB — PROTIME-INR
INR: 1.03 (ref 0.00–1.49)
Prothrombin Time: 13.6 seconds (ref 11.6–15.2)

## 2014-06-26 LAB — HEPARIN LEVEL (UNFRACTIONATED): HEPARIN UNFRACTIONATED: 0.39 [IU]/mL (ref 0.30–0.70)

## 2014-06-26 MED ORDER — NEOMYCIN-COLIST-HC-THONZONIUM 3.3-3-10-0.5 MG/ML OT SUSP
2.0000 [drp] | Freq: Four times a day (QID) | OTIC | Status: DC
  Filled 2014-06-26: qty 5

## 2014-06-26 MED ORDER — NEOMYCIN-POLYMYXIN-HC 1 % OT SOLN
2.0000 [drp] | Freq: Four times a day (QID) | OTIC | Status: DC
  Administered 2014-06-26 – 2014-06-27 (×4): 2 [drp] via OTIC
  Filled 2014-06-26: qty 10

## 2014-06-26 MED ORDER — TECHNETIUM TC 99M SESTAMIBI GENERIC - CARDIOLITE
30.0000 | Freq: Once | INTRAVENOUS | Status: AC | PRN
  Administered 2014-06-26: 30 via INTRAVENOUS

## 2014-06-26 MED ORDER — OXYCODONE-ACETAMINOPHEN 5-325 MG PO TABS
1.0000 | ORAL_TABLET | Freq: Four times a day (QID) | ORAL | Status: DC | PRN
  Administered 2014-06-27: 2 via ORAL
  Filled 2014-06-26: qty 1
  Filled 2014-06-26: qty 2

## 2014-06-26 MED ORDER — SODIUM CHLORIDE 0.9 % IV SOLN
INTRAVENOUS | Status: DC
  Administered 2014-06-26: 23:00:00 via INTRAVENOUS

## 2014-06-26 MED ORDER — POLYETHYLENE GLYCOL 3350 17 G PO PACK
17.0000 g | PACK | Freq: Every day | ORAL | Status: DC
  Administered 2014-06-26: 17 g via ORAL
  Filled 2014-06-26 (×2): qty 1

## 2014-06-26 MED ORDER — MAGNESIUM HYDROXIDE 400 MG/5ML PO SUSP
5.0000 mL | Freq: Every day | ORAL | Status: DC | PRN
  Administered 2014-06-26: 5 mL via ORAL
  Filled 2014-06-26 (×2): qty 30

## 2014-06-26 NOTE — Progress Notes (Signed)
ANTICOAGULATION CONSULT NOTE - Follow Up Consult  Pharmacy Consult for Heparin Indication: chest pain/ACS  Allergies  Allergen Reactions  . Ibuprofen Anaphylaxis and Swelling  . Diazepam Other (See Comments)    Hallucinations   . Hydrocodone Other (See Comments)    hallucinations  . Penicillins Itching  . Contrast Media [Iodinated Diagnostic Agents] Itching and Rash    Patient Measurements: Height: 5\' 4"  (162.6 cm) Weight: (!) 340 lb (154.223 kg) IBW/kg (Calculated) : 54.7 Heparin Dosing Weight: 94 kg  Vital Signs: Temp: 98.1 F (36.7 C) (12/20 0500) BP: 101/43 mmHg (12/20 0500) Pulse Rate: 80 (12/20 0500)  Labs:  Recent Labs  06/24/14 1933 06/24/14 2315 06/25/14 0305 06/25/14 0745 06/26/14 0341  HGB 11.2*  --   --  10.7* 10.7*  HCT 34.3*  --   --  33.3* 32.9*  PLT 287  --   --  303 285  LABPROT  --   --   --  13.7  --   INR  --   --   --  1.04  --   HEPARINUNFRC  --  0.41 0.36  --  0.39  CREATININE 0.70  --   --  0.76  --   TROPONINI <0.30 <0.30  --  <0.30  --     Estimated Creatinine Clearance: 132.5 mL/min (by C-G formula based on Cr of 0.76).   Medications:  Scheduled:  . aspirin EC  81 mg Oral Daily  . atorvastatin  40 mg Oral q1800  . cyclobenzaprine  10 mg Oral QHS  . darifenacin  7.5 mg Oral Daily  . ferrous sulfate  325 mg Oral Q breakfast  . gabapentin  300 mg Oral BID  . metoprolol tartrate  12.5 mg Oral BID  . multivitamin with minerals  1 tablet Oral Daily  . neomycin-colistin-hydrocortisone-thonzonium  2 drop Both Ears 4 times per day  . polyethylene glycol  17 g Oral Daily  . potassium chloride  10 mEq Oral Daily  . sodium chloride  3 mL Intravenous Q12H   Infusions:  . heparin 1,300 Units/hr (06/26/14 0727)    Assessment: CC: Peggy Avery is an 43 y.o. female admitted on 06/24/2014 presenting with CP.  Recent fall on left side prior to onset of pain. Pharmacy was consulted to dose heparin for r/o ACS.     PMH: Obesity,  DM  Anticoagulation: R/O ACS - Heparin.  Heparin level therapeutic at 0.39.  No bleeding reported. H/H low, plt wnl.   Cardiovascular: R/O ACS - BP within goal, HR 80-90s, CE neg x3, LP 12/19 LDL 84, TC 138, TG 114.  12/19 2D Echo revealed LVEF 55-60%, moderate LVH.  ASA 81, Ator 40, metoprolol tartrate 12.5 mg.    Nephrology: CrCl >100, Lytes wnl.  K-dur 10 meQ daily.   Goal of Therapy:  Heparin level 0.3-0.7 units/ml Monitor platelets by anticoagulation protocol: Yes   Plan:  - Continue heparin drip at 1300 units/hr.  - Daily heparin level and CBC.  - Monitor for signs and symptoms of bleeding  Hassie Bruce, Pharm. D. Clinical Pharmacy Resident Pager: [phone] Ph: [phone] 06/26/2014 9:54 AM

## 2014-06-26 NOTE — Progress Notes (Signed)
Ref: Birdie Riddle, MD   Subjective:  Recurrent chest pain. Abnormal nuclear stress test.  Objective:  Vital Signs in the last 24 hours: Temp:  [98.1 F (36.7 C)-98.4 F (36.9 C)] 98.1 F (36.7 C) (12/20 0500) Pulse Rate:  [80] 80 (12/20 0500) Cardiac Rhythm:  [-] Normal sinus rhythm (12/20 0722) Resp:  [14-19] 19 (12/20 0500) BP: (100-101)/(40-43) 101/43 mmHg (12/20 0500) SpO2:  [99 %-100 %] 99 % (12/20 0500) Weight:  [154.223 kg (340 lb)] 154.223 kg (340 lb) (12/20 0500)  Physical Exam: BP Readings from Last 1 Encounters:  06/26/14 101/43    Wt Readings from Last 1 Encounters:  06/26/14 154.223 kg (340 lb)    Weight change: 0.953 kg (2 lb 1.6 oz)  HEENT: [place]/AT, Eyes-Brown, PERL, EOMI, Conjunctiva-Pink, Sclera-Non-icteric Neck: No JVD, No bruit, Trachea midline. Lungs:  Clear, Bilateral. Cardiac:  Regular rhythm, normal S1 and S2, no S3.  Abdomen:  Soft, non-tender. Extremities:  No edema present. No cyanosis. No clubbing. CNS: AxOx3, Cranial nerves grossly intact, moves all 4 extremities. Right handed. Skin: Warm and dry.   Intake/Output from previous day:      Lab Results: BMET    Component Value Date/Time   NA 141 06/25/2014 0745   NA 139 06/24/2014 1933   NA 140 06/06/2014 2044   K 4.1 06/25/2014 0745   K 3.6* 06/24/2014 1933   K 4.2 06/06/2014 2044   CL 104 06/25/2014 0745   CL 102 06/24/2014 1933   CL 103 06/06/2014 2044   CO2 27 06/25/2014 0745   CO2 23 06/24/2014 1933   CO2 23 06/06/2014 2044   GLUCOSE 96 06/25/2014 0745   GLUCOSE 84 06/24/2014 1933   GLUCOSE 129* 06/06/2014 2044   BUN 11 06/25/2014 0745   BUN 12 06/24/2014 1933   BUN 12 06/06/2014 2044   CREATININE 0.76 06/25/2014 0745   CREATININE 0.70 06/24/2014 1933   CREATININE 0.75 06/06/2014 2044   CALCIUM 9.3 06/25/2014 0745   CALCIUM 9.4 06/24/2014 1933   CALCIUM 9.7 06/06/2014 2044   GFRNONAA >90 06/25/2014 0745   GFRNONAA >90 06/24/2014 1933   GFRNONAA >90 06/06/2014 2044   GFRAA >90 06/25/2014 0745   GFRAA >90 06/24/2014 1933   GFRAA >90 06/06/2014 2044   CBC    Component Value Date/Time   WBC 9.2 06/26/2014 0341   RBC 3.68* 06/26/2014 0341   HGB 10.7* 06/26/2014 0341   HCT 32.9* 06/26/2014 0341   PLT 285 06/26/2014 0341   MCV 89.4 06/26/2014 0341   MCH 29.1 06/26/2014 0341   MCHC 32.5 06/26/2014 0341   RDW 13.8 06/26/2014 0341   LYMPHSABS 4.1* 06/24/2014 1933   MONOABS 0.5 06/24/2014 1933   EOSABS 0.1 06/24/2014 1933   BASOSABS 0.0 06/24/2014 1933   HEPATIC Function Panel No results for input(s): PROT in the last 8760 hours.  Invalid input(s):  ALBUMIN,  AST,  ALT,  ALKPHOS,  BILIDIR,  IBILI HEMOGLOBIN A1C No components found for: HGA1C,  MPG CARDIAC ENZYMES Lab Results  Component Value Date   TROPONINI <0.30 06/25/2014   TROPONINI <0.30 06/24/2014   TROPONINI <0.30 06/24/2014   BNP No results for input(s): PROBNP in the last 8760 hours. TSH  Recent Labs  08/23/13 1530  TSH 2.896   CHOLESTEROL  Recent Labs  06/25/14 0305  CHOL 138    Scheduled Meds: . aspirin EC  81 mg Oral Daily  . atorvastatin  40 mg Oral q1800  . cyclobenzaprine  10 mg Oral QHS  .  darifenacin  7.5 mg Oral Daily  . ferrous sulfate  325 mg Oral Q breakfast  . gabapentin  300 mg Oral BID  . metoprolol tartrate  12.5 mg Oral BID  . multivitamin with minerals  1 tablet Oral Daily  . NEOMYCIN-POLYMYXIN-HYDROCORTISONE  2 drop Both Ears 4 times per day  . polyethylene glycol  17 g Oral Daily  . potassium chloride  10 mEq Oral Daily  . sodium chloride  3 mL Intravenous Q12H   Continuous Infusions: . heparin 1,300 Units/hr (06/26/14 0727)   PRN Meds:.sodium chloride, acetaminophen, albuterol, amitriptyline, magnesium hydroxide, nitroGLYCERIN, ondansetron (ZOFRAN) IV, sodium chloride  Assessment/Plan: Chest pain at rest Obesity, morbid H/O hyperglycemia Chronic back pain Chronic neck pain Mild anemia r/o iron deficiency     LOS: 2 days    Dixie Dials  MD  06/26/2014, 4:45 PM

## 2014-06-26 NOTE — Progress Notes (Signed)
Chaplain responded to request for assistance in filling out an advance directive.  Patient states that she desires all measures for life support in all circumstances in contrast to her husband who favors removing life support in circumstances of diminished quality of life. She states that she doesn't have any problems with her husband in their relationship, but that he has a different point of view on end-of-life care if anything were to happen to her.  Patient chose a health care power of attorney and a secondary advocate and was assisted in filling out advance directive.  Chaplain located two witnesses in a nearby room and a notary public from the ED [facility] Hurry in [place]).  Patient also requested prayer and was very responsive to spiritual support. She asked if chaplain provided counseling services outside of hospital because she desires a closer spiritual support than her pastor is able to provide (there are 4,000 members in the church).  Chaplain directed the patient to request a chaplain after her catheterization who will be able to offer suggestions about possible community services.  Chaplain will also recommend follow-up by floor chaplain.  Please call as needed for support until then.   Luana Shu [phone]    06/26/14 2036  Clinical Encounter Type  Visited With Patient and family together  Visit Type Spiritual support (Patient requests assistance filling out advance directive)  Referral From Patient  Consult/Referral To Chaplain  Spiritual Encounters  Spiritual Needs Prayer;Emotional  Stress Factors  Patient Stress Factors Health changes  Advance Directives (For Healthcare)  Does patient have an advance directive? Yes  Would patient like information on creating an advanced directive? Yes - Scientist, clinical (histocompatibility and immunogenetics) given  Type of Paramedic of [place];Living will  Does patient want to make changes to advanced directive? Yes - information  given  Copy of advanced directive(s) in chart? Yes (Original given to patient; copy placed in chart)

## 2014-06-26 NOTE — Progress Notes (Signed)
Ref: Birdie Riddle, MD   Subjective:  Feeling better except  Left ear fullness. Finished stress part of nuclear stress test.  Objective:  Vital Signs in the last 24 hours: Temp:  [97.9 F (36.6 C)-98.4 F (36.9 C)] 98.4 F (36.9 C) (12/19 2100) Pulse Rate:  [77-125] 80 (12/19 2100) Cardiac Rhythm:  [-] Normal sinus rhythm (12/19 1930) Resp:  [14-18] 14 (12/19 2342) BP: (100-132)/(40-68) 100/40 mmHg (12/19 2100) SpO2:  [99 %-100 %] 100 % (12/19 2100) Weight:  [153.2 kg (337 lb 11.9 oz)] 153.2 kg (337 lb 11.9 oz) (12/19 8341)  Physical Exam: BP Readings from Last 1 Encounters:  06/25/14 100/40    Wt Readings from Last 1 Encounters:  06/25/14 153.2 kg (337 lb 11.9 oz)    Weight change:   HEENT: [place]/AT, Eyes-Brown, PERL, EOMI, Conjunctiva-Pink, Sclera-Non-icteric Neck: No JVD, No bruit, Trachea midline. Lungs:  Clear, Bilateral. Cardiac:  Regular rhythm, normal S1 and S2, no S3.  Abdomen:  Soft, non-tender. Extremities:  No edema present. No cyanosis. No clubbing. CNS: AxOx3, Cranial nerves grossly intact, moves all 4 extremities. Right handed. Skin: Warm and dry.   Intake/Output from previous day:      Lab Results: BMET    Component Value Date/Time   NA 141 06/25/2014 0745   NA 139 06/24/2014 1933   NA 140 06/06/2014 2044   K 4.1 06/25/2014 0745   K 3.6* 06/24/2014 1933   K 4.2 06/06/2014 2044   CL 104 06/25/2014 0745   CL 102 06/24/2014 1933   CL 103 06/06/2014 2044   CO2 27 06/25/2014 0745   CO2 23 06/24/2014 1933   CO2 23 06/06/2014 2044   GLUCOSE 96 06/25/2014 0745   GLUCOSE 84 06/24/2014 1933   GLUCOSE 129* 06/06/2014 2044   BUN 11 06/25/2014 0745   BUN 12 06/24/2014 1933   BUN 12 06/06/2014 2044   CREATININE 0.76 06/25/2014 0745   CREATININE 0.70 06/24/2014 1933   CREATININE 0.75 06/06/2014 2044   CALCIUM 9.3 06/25/2014 0745   CALCIUM 9.4 06/24/2014 1933   CALCIUM 9.7 06/06/2014 2044   GFRNONAA >90 06/25/2014 0745   GFRNONAA >90 06/24/2014 1933    GFRNONAA >90 06/06/2014 2044   GFRAA >90 06/25/2014 0745   GFRAA >90 06/24/2014 1933   GFRAA >90 06/06/2014 2044   CBC    Component Value Date/Time   WBC 7.9 06/25/2014 0745   RBC 3.80* 06/25/2014 0745   HGB 10.7* 06/25/2014 0745   HCT 33.3* 06/25/2014 0745   PLT 303 06/25/2014 0745   MCV 87.6 06/25/2014 0745   MCH 28.2 06/25/2014 0745   MCHC 32.1 06/25/2014 0745   RDW 13.8 06/25/2014 0745   LYMPHSABS 4.1* 06/24/2014 1933   MONOABS 0.5 06/24/2014 1933   EOSABS 0.1 06/24/2014 1933   BASOSABS 0.0 06/24/2014 1933   HEPATIC Function Panel No results for input(s): PROT in the last 8760 hours.  Invalid input(s):  ALBUMIN,  AST,  ALT,  ALKPHOS,  BILIDIR,  IBILI HEMOGLOBIN A1C No components found for: HGA1C,  MPG CARDIAC ENZYMES Lab Results  Component Value Date   TROPONINI <0.30 06/25/2014   TROPONINI <0.30 06/24/2014   TROPONINI <0.30 06/24/2014   BNP No results for input(s): PROBNP in the last 8760 hours. TSH  Recent Labs  08/23/13 1530  TSH 2.896   CHOLESTEROL  Recent Labs  06/25/14 0305  CHOL 138    Scheduled Meds: . aspirin EC  81 mg Oral Daily  . atorvastatin  40 mg Oral q1800  .  cyclobenzaprine  10 mg Oral QHS  . darifenacin  7.5 mg Oral Daily  . ferrous sulfate  325 mg Oral Q breakfast  . gabapentin  300 mg Oral BID  . metoprolol tartrate  12.5 mg Oral BID  . multivitamin with minerals  1 tablet Oral Daily  . potassium chloride  10 mEq Oral Daily  . sodium chloride  3 mL Intravenous Q12H   Continuous Infusions: . heparin 1,300 Units/hr (06/25/14 1254)   PRN Meds:.sodium chloride, acetaminophen, albuterol, amitriptyline, nitroGLYCERIN, ondansetron (ZOFRAN) IV, sodium chloride  Assessment/Plan: Chest pain at rest Obesity, morbid H/O hyperglycemia Chronic back pain Chronic neck pain  Continue medical treatment.    LOS: 2 days    Dixie Dials  MD  06/26/2014, 12:33 AM

## 2014-06-27 ENCOUNTER — Encounter (HOSPITAL_COMMUNITY): Payer: Self-pay | Admitting: Cardiovascular Disease

## 2014-06-27 ENCOUNTER — Encounter (HOSPITAL_COMMUNITY)
Admission: AD | Disposition: A | Discharge status: Home / Self Care | Payer: Self-pay | Source: Ambulatory Visit | Attending: Cardiovascular Disease

## 2014-06-27 HISTORY — PX: LEFT HEART CATHETERIZATION WITH CORONARY ANGIOGRAM: SHX5451

## 2014-06-27 LAB — CBC
HCT: 32.4 % — ABNORMAL LOW (ref 36.0–46.0)
Hemoglobin: 10.2 g/dL — ABNORMAL LOW (ref 12.0–15.0)
MCH: 27.6 pg (ref 26.0–34.0)
MCHC: 31.5 g/dL (ref 30.0–36.0)
MCV: 87.8 fL (ref 78.0–100.0)
PLATELETS: 284 10*3/uL (ref 150–400)
RBC: 3.69 MIL/uL — ABNORMAL LOW (ref 3.87–5.11)
RDW: 13.8 % (ref 11.5–15.5)
WBC: 9.1 10*3/uL (ref 4.0–10.5)

## 2014-06-27 LAB — HEPARIN LEVEL (UNFRACTIONATED): Heparin Unfractionated: 0.45 IU/mL (ref 0.30–0.70)

## 2014-06-27 LAB — GLUCOSE, CAPILLARY: Glucose-Capillary: 105 mg/dL — ABNORMAL HIGH (ref 70–99)

## 2014-06-27 LAB — POCT ACTIVATED CLOTTING TIME: ACTIVATED CLOTTING TIME: 134 s

## 2014-06-27 LAB — PREGNANCY, URINE: PREG TEST UR: NEGATIVE

## 2014-06-27 SURGERY — LEFT HEART CATHETERIZATION WITH CORONARY ANGIOGRAM
Anesthesia: LOCAL

## 2014-06-27 MED ORDER — NITROGLYCERIN 1 MG/10 ML FOR IR/CATH LAB
INTRA_ARTERIAL | Status: AC
  Filled 2014-06-27: qty 10

## 2014-06-27 MED ORDER — METHYLPREDNISOLONE SODIUM SUCC 125 MG IJ SOLR
125.0000 mg | INTRAMUSCULAR | Status: AC
  Administered 2014-06-27: 125 mg via INTRAVENOUS
  Filled 2014-06-27: qty 2

## 2014-06-27 MED ORDER — DIPHENHYDRAMINE HCL 50 MG/ML IJ SOLN
25.0000 mg | INTRAMUSCULAR | Status: AC
  Administered 2014-06-27: 25 mg via INTRAVENOUS
  Filled 2014-06-27: qty 1

## 2014-06-27 MED ORDER — SODIUM CHLORIDE 0.9 % IV SOLN
INTRAVENOUS | Status: AC
  Administered 2014-06-27: 1000 mL via INTRAVENOUS

## 2014-06-27 MED ORDER — MIDAZOLAM HCL 2 MG/2ML IJ SOLN
INTRAMUSCULAR | Status: AC
  Filled 2014-06-27: qty 2

## 2014-06-27 MED ORDER — FENTANYL CITRATE 0.05 MG/ML IJ SOLN
INTRAMUSCULAR | Status: AC
  Filled 2014-06-27: qty 2

## 2014-06-27 MED ORDER — HEPARIN (PORCINE) IN NACL 2-0.9 UNIT/ML-% IJ SOLN
INTRAMUSCULAR | Status: AC
  Filled 2014-06-27: qty 1000

## 2014-06-27 MED ORDER — LIDOCAINE HCL (PF) 1 % IJ SOLN
INTRAMUSCULAR | Status: AC
  Filled 2014-06-27: qty 30

## 2014-06-27 MED ORDER — POTASSIUM CHLORIDE ER 10 MEQ PO TBCR: 10.0000 meq | EXTENDED_RELEASE_TABLET | Freq: Every day | ORAL | Status: DC

## 2014-06-27 MED ORDER — FAMOTIDINE IN NACL 20-0.9 MG/50ML-% IV SOLN
20.0000 mg | INTRAVENOUS | Status: AC
  Administered 2014-06-27: 20 mg via INTRAVENOUS
  Filled 2014-06-27: qty 50

## 2014-06-27 MED ORDER — NEOMYCIN-POLYMYXIN-HC 1 % OT SOLN: 2.0000 [drp] | Freq: Four times a day (QID) | OTIC | Status: AC

## 2014-06-27 NOTE — CV Procedure (Signed)
PROCEDURE:  Left heart catheterization with selective coronary angiography, left ventriculogram.  CLINICAL HISTORY:  This is a 43 year old female has recurrent chest pain and abnormal nuclear stress test.  The risks, benefits, and details of the procedure were explained to the patient.  The patient verbalized understanding and wanted to proceed.  Informed written consent was obtained.  PROCEDURE TECHNIQUE:  The patient was approached from the right femoral artery using a 5 French short sheath.  Left coronary angiography was done using a Judkins L4 guide catheter.  Right coronary angiography was done using a Judkins R4 guide catheter.  Left ventriculography was done using a pigtail catheter.    CONTRAST:  Total of 50 cc.  COMPLICATIONS:  None.  At the end of the procedure a manual pressure device was used for hemostasis.    HEMODYNAMICS:  Aortic pressure was 133/72; LV pressure was 130/23; LVEDP 39.  There was no gradient between the left ventricle and aorta.    ANGIOGRAM/CORONARY ARTERIOGRAM:   The left main coronary artery is normal.  The left anterior descending artery is normal..  The left circumflex artery is normal.  The right coronary artery is normal.  LEFT VENTRICULOGRAM:  Left ventricular angiogram was done in the 30 RAO projection and revealed normal left ventricular wall motion and systolic function with an estimated ejection fraction of 55-60%.  LVEDP was 39 mmHg.  IMPRESSION OF HEART CATHETERIZATION:   1. Normal left main coronary artery. 2. Normal left anterior descending artery and its branches. 3. Normal left circumflex artery and its branches. 4. Normal right coronary artery. 5. Normal left ventricular systolic function.  LVEDP 39 mmHg.  Ejection fraction 55-60 %.  RECOMMENDATION:   Medical treatment.

## 2014-06-27 NOTE — Progress Notes (Signed)
UR Completed Azarius Lambson Graves-Bigelow, RN,BSN [phone]  

## 2014-06-27 NOTE — H&P (View-Only) (Signed)
Ref: Birdie Riddle, MD   Subjective:  Recurrent chest pain. Abnormal nuclear stress test.  Objective:  Vital Signs in the last 24 hours: Temp:  [98.1 F (36.7 C)-98.4 F (36.9 C)] 98.1 F (36.7 C) (12/20 0500) Pulse Rate:  [80] 80 (12/20 0500) Cardiac Rhythm:  [-] Normal sinus rhythm (12/20 0722) Resp:  [14-19] 19 (12/20 0500) BP: (100-101)/(40-43) 101/43 mmHg (12/20 0500) SpO2:  [99 %-100 %] 99 % (12/20 0500) Weight:  [154.223 kg (340 lb)] 154.223 kg (340 lb) (12/20 0500)  Physical Exam: BP Readings from Last 1 Encounters:  06/26/14 101/43    Wt Readings from Last 1 Encounters:  06/26/14 154.223 kg (340 lb)    Weight change: 0.953 kg (2 lb 1.6 oz)  HEENT: [place]/AT, Eyes-Brown, PERL, EOMI, Conjunctiva-Pink, Sclera-Non-icteric Neck: No JVD, No bruit, Trachea midline. Lungs:  Clear, Bilateral. Cardiac:  Regular rhythm, normal S1 and S2, no S3.  Abdomen:  Soft, non-tender. Extremities:  No edema present. No cyanosis. No clubbing. CNS: AxOx3, Cranial nerves grossly intact, moves all 4 extremities. Right handed. Skin: Warm and dry.   Intake/Output from previous day:      Lab Results: BMET    Component Value Date/Time   NA 141 06/25/2014 0745   NA 139 06/24/2014 1933   NA 140 06/06/2014 2044   K 4.1 06/25/2014 0745   K 3.6* 06/24/2014 1933   K 4.2 06/06/2014 2044   CL 104 06/25/2014 0745   CL 102 06/24/2014 1933   CL 103 06/06/2014 2044   CO2 27 06/25/2014 0745   CO2 23 06/24/2014 1933   CO2 23 06/06/2014 2044   GLUCOSE 96 06/25/2014 0745   GLUCOSE 84 06/24/2014 1933   GLUCOSE 129* 06/06/2014 2044   BUN 11 06/25/2014 0745   BUN 12 06/24/2014 1933   BUN 12 06/06/2014 2044   CREATININE 0.76 06/25/2014 0745   CREATININE 0.70 06/24/2014 1933   CREATININE 0.75 06/06/2014 2044   CALCIUM 9.3 06/25/2014 0745   CALCIUM 9.4 06/24/2014 1933   CALCIUM 9.7 06/06/2014 2044   GFRNONAA >90 06/25/2014 0745   GFRNONAA >90 06/24/2014 1933   GFRNONAA >90 06/06/2014 2044   GFRAA >90 06/25/2014 0745   GFRAA >90 06/24/2014 1933   GFRAA >90 06/06/2014 2044   CBC    Component Value Date/Time   WBC 9.2 06/26/2014 0341   RBC 3.68* 06/26/2014 0341   HGB 10.7* 06/26/2014 0341   HCT 32.9* 06/26/2014 0341   PLT 285 06/26/2014 0341   MCV 89.4 06/26/2014 0341   MCH 29.1 06/26/2014 0341   MCHC 32.5 06/26/2014 0341   RDW 13.8 06/26/2014 0341   LYMPHSABS 4.1* 06/24/2014 1933   MONOABS 0.5 06/24/2014 1933   EOSABS 0.1 06/24/2014 1933   BASOSABS 0.0 06/24/2014 1933   HEPATIC Function Panel No results for input(s): PROT in the last 8760 hours.  Invalid input(s):  ALBUMIN,  AST,  ALT,  ALKPHOS,  BILIDIR,  IBILI HEMOGLOBIN A1C No components found for: HGA1C,  MPG CARDIAC ENZYMES Lab Results  Component Value Date   TROPONINI <0.30 06/25/2014   TROPONINI <0.30 06/24/2014   TROPONINI <0.30 06/24/2014   BNP No results for input(s): PROBNP in the last 8760 hours. TSH  Recent Labs  08/23/13 1530  TSH 2.896   CHOLESTEROL  Recent Labs  06/25/14 0305  CHOL 138    Scheduled Meds: . aspirin EC  81 mg Oral Daily  . atorvastatin  40 mg Oral q1800  . cyclobenzaprine  10 mg Oral QHS  .  darifenacin  7.5 mg Oral Daily  . ferrous sulfate  325 mg Oral Q breakfast  . gabapentin  300 mg Oral BID  . metoprolol tartrate  12.5 mg Oral BID  . multivitamin with minerals  1 tablet Oral Daily  . NEOMYCIN-POLYMYXIN-HYDROCORTISONE  2 drop Both Ears 4 times per day  . polyethylene glycol  17 g Oral Daily  . potassium chloride  10 mEq Oral Daily  . sodium chloride  3 mL Intravenous Q12H   Continuous Infusions: . heparin 1,300 Units/hr (06/26/14 0727)   PRN Meds:.sodium chloride, acetaminophen, albuterol, amitriptyline, magnesium hydroxide, nitroGLYCERIN, ondansetron (ZOFRAN) IV, sodium chloride  Assessment/Plan: Chest pain at rest Obesity, morbid H/O hyperglycemia Chronic back pain Chronic neck pain Mild anemia r/o iron deficiency     LOS: 2 days    Dixie Dials  MD  06/26/2014, 4:45 PM

## 2014-06-27 NOTE — Discharge Instructions (Signed)

## 2014-06-27 NOTE — Progress Notes (Signed)
Site area: RFA Site Prior to Removal:  Level 0 Pressure Applied For:91min Manual:   yes Patient Status During Pull:  stable Post Pull Site:  Level 0 Post Pull Instructions Given: yes  Post Pull Pulses Present: palpable Dressing Applied:  clear Bedrest begins @ 0930 Comments:

## 2014-06-27 NOTE — Interval H&P Note (Signed)
History and Physical Interval Note:  06/27/2014 7:57 AM  Peggy Avery  has presented today for surgery, with the diagnosis of Abnormal stress test and chest pain  The various methods of treatment have been discussed with the patient and family. After consideration of risks, benefits and other options for treatment, the patient has consented to  Procedure(s): LEFT HEART CATHETERIZATION WITH CORONARY ANGIOGRAM (N/A) as a surgical intervention .  The patient's history has been reviewed, patient examined, no change in status, stable for surgery.  I have reviewed the patient's chart and labs.  Questions were answered to the patient's satisfaction.     [name]

## 2014-06-27 NOTE — Discharge Summary (Signed)
Physician Discharge Summary  Patient ID: Peggy Avery MRN: [id number] DOB/AGE: Feb 02, 1971 43 y.o.  Admit date: 06/24/2014 Discharge date: 06/27/2014  Admission Diagnoses:  Discharge Diagnoses:  Principal Problem:   Chest pain at rest   Discharged Condition: good  Hospital Course: 43 year old female with a history of obesity and diabetes presented to office with a chief complaint of left sided chest pain that radiates to left shoulder & lasts for long time and intermittent in nature. Patient described the pain as a heavy squeezing sensation. Associated symptoms included shortness of breath and dyspnea on exertion, no fever or cough. Her nuclear stress test was suspicious for reversible ischemia of anterior apex. Her cardiac catheterization was normal. She was discharged in stable condition.  Consults: cardiology  Significant Diagnostic Studies: labs: Low Hgb of 10.7 to 11.7. Near normal WBC count and normal platelets count. Normal Iron level. Normal BMET and Troponin-I. Negative pregnancy test. Normal cardiac catheterization and Echocardiogram.  EKG showed SR and non-specific ST-T changes.   Treatments: analgesia: Vicodin and cardiac meds: Aspirin.  Discharge Exam: Blood pressure 124/75, pulse 101, temperature 98.2 F (36.8 C), temperature source Oral, resp. rate 18, height 5\' 4"  (1.626 m), weight 154.6 kg (340 lb 13.3 oz), last menstrual period 04/07/2014, SpO2 98 %. HEENT: [place]/AT, Eyes-Brown, PERL, EOMI, Conjunctiva-Pink, Sclera-Non-icteric Neck: No JVD, No bruit, Trachea midline. Lungs: Clear, Bilateral. Cardiac: Regular rhythm, normal S1 and S2, no S3.  Abdomen: Soft, non-tender. Extremities: No edema present. No cyanosis. No clubbing. CNS: AxOx3, Cranial nerves grossly intact, moves all 4 extremities. Right handed. Skin: Warm and dry.  Disposition: 01-Home or Self Care     Medication List    STOP taking these medications        meloxicam 15 MG tablet  Commonly  known as:  MOBIC     solifenacin 10 MG tablet  Commonly known as:  VESICARE      TAKE these medications        acetaminophen 500 MG tablet  Commonly known as:  TYLENOL  Take 1,000 mg by mouth every 6 (six) hours as needed (menstrual cramps).     albuterol 108 (90 BASE) MCG/ACT inhaler  Commonly known as:  PROVENTIL HFA;VENTOLIN HFA  Inhale 1-2 puffs into the lungs every 6 (six) hours as needed for wheezing or shortness of breath.     amitriptyline 10 MG tablet  Commonly known as:  ELAVIL  Take 10 mg by mouth at bedtime as needed for sleep (migraines).     aspirin EC 81 MG tablet  Take 81 mg by mouth daily.     cholecalciferol 1000 UNITS tablet  Commonly known as:  VITAMIN D  Take 1,000 Units by mouth daily.     cyclobenzaprine 10 MG tablet  Commonly known as:  FLEXERIL  Take 10 mg by mouth at bedtime.     ferrous sulfate 325 (65 FE) MG tablet  Take 325 mg by mouth daily with breakfast.     gabapentin 300 MG capsule  Commonly known as:  NEURONTIN  Take 300 mg by mouth 2 (two) times daily.     multivitamin with minerals Tabs tablet  Take 1 tablet by mouth daily. Centrum     NEOMYCIN-POLYMYXIN-HYDROCORTISONE 1 % Soln otic solution  Commonly known as:  CORTISPORIN  Place 2 drops into both ears every 6 (six) hours.     oxyCODONE-acetaminophen 5-325 MG per tablet  Commonly known as:  PERCOCET/ROXICET  Take 2 tablets by mouth every 4 (four)  hours as needed for severe pain.     potassium chloride 10 MEQ tablet  Commonly known as:  K-DUR  Take 1 tablet (10 mEq total) by mouth daily.     VITAMIN C PO  Take 2 tablets by mouth daily.           Follow-up Information    Follow up with [facility] S, MD. Schedule an appointment as soon as possible for a visit in 1 month.   Specialty:  Cardiology   Contact information:   [place] [address] [phone]       Signed: Birdie Riddle 06/27/2014, 5:57 PM

## 2014-07-02 ENCOUNTER — Inpatient Hospital Stay (HOSPITAL_COMMUNITY)
Admission: EM | Admit: 2014-07-02 | Discharge: 2014-07-05 | DRG: 394 | Disposition: A | Discharge status: Home / Self Care | Payer: Medicaid Other | Attending: Cardiovascular Disease | Admitting: Cardiovascular Disease

## 2014-07-02 ENCOUNTER — Emergency Department (HOSPITAL_COMMUNITY): Payer: Medicaid Other

## 2014-07-02 DIAGNOSIS — R109 Unspecified abdominal pain: Secondary | ICD-10-CM

## 2014-07-02 DIAGNOSIS — M797 Fibromyalgia: Secondary | ICD-10-CM | POA: Diagnosis present

## 2014-07-02 DIAGNOSIS — G43909 Migraine, unspecified, not intractable, without status migrainosus: Secondary | ICD-10-CM | POA: Diagnosis present

## 2014-07-02 DIAGNOSIS — G8929 Other chronic pain: Secondary | ICD-10-CM | POA: Diagnosis present

## 2014-07-02 DIAGNOSIS — Z888 Allergy status to other drugs, medicaments and biological substances status: Secondary | ICD-10-CM

## 2014-07-02 DIAGNOSIS — J45909 Unspecified asthma, uncomplicated: Secondary | ICD-10-CM | POA: Diagnosis present

## 2014-07-02 DIAGNOSIS — Z880 Allergy status to penicillin: Secondary | ICD-10-CM

## 2014-07-02 DIAGNOSIS — D500 Iron deficiency anemia secondary to blood loss (chronic): Secondary | ICD-10-CM | POA: Diagnosis present

## 2014-07-02 DIAGNOSIS — Z885 Allergy status to narcotic agent status: Secondary | ICD-10-CM

## 2014-07-02 DIAGNOSIS — G4733 Obstructive sleep apnea (adult) (pediatric): Secondary | ICD-10-CM | POA: Diagnosis present

## 2014-07-02 DIAGNOSIS — M549 Dorsalgia, unspecified: Secondary | ICD-10-CM | POA: Diagnosis present

## 2014-07-02 DIAGNOSIS — K661 Hemoperitoneum: Secondary | ICD-10-CM

## 2014-07-02 DIAGNOSIS — Z8673 Personal history of transient ischemic attack (TIA), and cerebral infarction without residual deficits: Secondary | ICD-10-CM

## 2014-07-02 DIAGNOSIS — E119 Type 2 diabetes mellitus without complications: Secondary | ICD-10-CM | POA: Diagnosis present

## 2014-07-02 DIAGNOSIS — E039 Hypothyroidism, unspecified: Secondary | ICD-10-CM | POA: Diagnosis present

## 2014-07-02 DIAGNOSIS — M542 Cervicalgia: Secondary | ICD-10-CM | POA: Diagnosis present

## 2014-07-02 DIAGNOSIS — M170 Bilateral primary osteoarthritis of knee: Secondary | ICD-10-CM | POA: Diagnosis present

## 2014-07-02 DIAGNOSIS — Z6841 Body Mass Index (BMI) 40.0 and over, adult: Secondary | ICD-10-CM

## 2014-07-02 DIAGNOSIS — R58 Hemorrhage, not elsewhere classified: Secondary | ICD-10-CM

## 2014-07-02 DIAGNOSIS — L03314 Cellulitis of groin: Secondary | ICD-10-CM | POA: Diagnosis present

## 2014-07-02 LAB — BASIC METABOLIC PANEL
Anion gap: 8 (ref 5–15)
BUN: 8 mg/dL (ref 6–23)
CO2: 24 mmol/L (ref 19–32)
Calcium: 8.9 mg/dL (ref 8.4–10.5)
Chloride: 100 mEq/L (ref 96–112)
Creatinine, Ser: 0.88 mg/dL (ref 0.50–1.10)
GFR, EST NON AFRICAN AMERICAN: 78 mL/min — AB (ref >90)
Glucose, Bld: 110 mg/dL — ABNORMAL HIGH (ref 70–99)
POTASSIUM: 3.7 mmol/L (ref 3.5–5.1)
SODIUM: 132 mmol/L — AB (ref 135–145)

## 2014-07-02 LAB — LIPASE, BLOOD: LIPASE: 23 U/L (ref 11–59)

## 2014-07-02 LAB — URINALYSIS, ROUTINE W REFLEX MICROSCOPIC
Bilirubin Urine: NEGATIVE
Glucose, UA: NEGATIVE mg/dL
Ketones, ur: NEGATIVE mg/dL
Leukocytes, UA: NEGATIVE
Nitrite: NEGATIVE
PH: 5.5 (ref 5.0–8.0)
Protein, ur: NEGATIVE mg/dL
SPECIFIC GRAVITY, URINE: 1.024 (ref 1.005–1.030)
Urobilinogen, UA: 1 mg/dL (ref 0.0–1.0)

## 2014-07-02 LAB — CBC WITH DIFFERENTIAL/PLATELET
Basophils Absolute: 0 10*3/uL (ref 0.0–0.1)
Basophils Relative: 0 % (ref 0–1)
EOS ABS: 0 10*3/uL (ref 0.0–0.7)
Eosinophils Relative: 0 % (ref 0–5)
HCT: 33.4 % — ABNORMAL LOW (ref 36.0–46.0)
Hemoglobin: 10.8 g/dL — ABNORMAL LOW (ref 12.0–15.0)
LYMPHS ABS: 3.2 10*3/uL (ref 0.7–4.0)
LYMPHS PCT: 20 % (ref 12–46)
MCH: 28.1 pg (ref 26.0–34.0)
MCHC: 32.3 g/dL (ref 30.0–36.0)
MCV: 87 fL (ref 78.0–100.0)
Monocytes Absolute: 1.3 10*3/uL — ABNORMAL HIGH (ref 0.1–1.0)
Monocytes Relative: 8 % (ref 3–12)
Neutro Abs: 11.2 10*3/uL — ABNORMAL HIGH (ref 1.7–7.7)
Neutrophils Relative %: 72 % (ref 43–77)
PLATELETS: 302 10*3/uL (ref 150–400)
RBC: 3.84 MIL/uL — AB (ref 3.87–5.11)
RDW: 13.9 % (ref 11.5–15.5)
WBC: 15.7 10*3/uL — AB (ref 4.0–10.5)

## 2014-07-02 LAB — URINE MICROSCOPIC-ADD ON

## 2014-07-02 LAB — POC URINE PREG, ED: Preg Test, Ur: NEGATIVE

## 2014-07-02 MED ORDER — HYDROMORPHONE HCL 1 MG/ML IJ SOLN
1.0000 mg | Freq: Once | INTRAMUSCULAR | Status: AC
  Administered 2014-07-02: 1 mg via INTRAVENOUS
  Filled 2014-07-02: qty 1

## 2014-07-02 MED ORDER — OXYCODONE-ACETAMINOPHEN 5-325 MG PO TABS
1.0000 | ORAL_TABLET | Freq: Once | ORAL | Status: AC
  Administered 2014-07-02: 1 via ORAL
  Filled 2014-07-02: qty 1

## 2014-07-02 MED ORDER — CEPHALEXIN 500 MG PO CAPS: 500.0000 mg | ORAL_CAPSULE | Freq: Four times a day (QID) | ORAL | Status: DC

## 2014-07-02 MED ORDER — ONDANSETRON HCL 4 MG/2ML IJ SOLN
4.0000 mg | Freq: Once | INTRAMUSCULAR | Status: AC
  Administered 2014-07-02: 4 mg via INTRAVENOUS
  Filled 2014-07-02: qty 2

## 2014-07-02 NOTE — ED Notes (Signed)
Presents with right flank pain, right groin pain and vaginal pain began yesterday associated with fever of 102 at home. Recently had a cardiac cath through groin on Monday-per pr "they found fibromyalgia all around my heart, no blockage"  rewports pain with urination and incontinence, which is abnormal for her.

## 2014-07-02 NOTE — ED Provider Notes (Signed)
CSN: [id number]     Arrival date & time 07/02/14  1611 History   First MD Initiated Contact with Patient 07/02/14 2050     Chief Complaint  Patient presents with  . Fever  . Flank Pain     HPI Presents with right flank pain, right groin pain and vaginal pain began yesterday associated with fever of 102 at home. Recently had a cardiac cath through groin on Monday-per pr "they found fibromyalgia all around my heart, no blockage" rewports pain with urination and incontinence, which is abnormal for her. Past Medical History  Diagnosis Date  . Hypothyroidism   . Obesity   . Overactive bladder   . Nerve damage in leg  . Pneumonia 2012; 2013; 2014    w/bronchitis  . Chronic bronchitis "q yr"  . OSA on CPAP   . Borderline type 2 diabetes mellitus   . Anemia   . Migraine     "probably q other week" (06/24/2014)  . TIA (transient ischemic attack) 11/2013  . Arthritis     "knees" (06/24/2014)  . Asthma    Past Surgical History  Procedure Laterality Date  . Cesarean section  2000  . Left heart catheterization with coronary angiogram N/A 06/27/2014    Procedure: LEFT HEART CATHETERIZATION WITH CORONARY ANGIOGRAM;  Surgeon: Birdie Riddle, MD;  Location: [facility];  Service: Cardiovascular;  Laterality: N/A;   Family History  Problem Relation Age of Onset  . Ovarian cancer Mother     Ovarian Cancer  . Prostate cancer Father     Prostate Cancer  . Heart disease Other     Grandparent  . Diabetes Other     Parent   History  Substance Use Topics  . Smoking status: Never Smoker   . Smokeless tobacco: Never Used  . Alcohol Use: No   OB History    Gravida Para Term Preterm AB TAB SAB Ectopic Multiple Living   2 1 1  1  1   1      Review of Systems  All other systems reviewed and are negative  Allergies  Ibuprofen; Diazepam; Hydrocodone; Penicillins; and Contrast media  Home Medications   Prior to Admission medications   Medication Sig Start Date End Date Taking?  Authorizing Provider  acetaminophen (TYLENOL) 500 MG tablet Take 1,000 mg by mouth every 6 (six) hours as needed (menstrual cramps).    Yes Historical Provider, MD  albuterol (PROVENTIL HFA;VENTOLIN HFA) 108 (90 BASE) MCG/ACT inhaler Inhale 1-2 puffs into the lungs every 6 (six) hours as needed for wheezing or shortness of breath. 02/12/14  Yes Clayton Bibles, PA-C  amitriptyline (ELAVIL) 10 MG tablet Take 10 mg by mouth at bedtime as needed for sleep (migraines).  03/21/14 03/21/15 Yes Historical Provider, MD  Ascorbic Acid (VITAMIN C PO) Take 2 tablets by mouth daily.   Yes Historical Provider, MD  cholecalciferol (VITAMIN D) 1000 UNITS tablet Take 1,000 Units by mouth daily.   Yes Historical Provider, MD  cyclobenzaprine (FLEXERIL) 10 MG tablet Take 10 mg by mouth at bedtime.    Yes Historical Provider, MD  ferrous sulfate 325 (65 FE) MG tablet Take 325 mg by mouth daily with breakfast.   Yes Historical Provider, MD  gabapentin (NEURONTIN) 300 MG capsule Take 300 mg by mouth 2 (two) times daily.    Yes Historical Provider, MD  Multiple Vitamin (MULTIVITAMIN WITH MINERALS) TABS tablet Take 1 tablet by mouth daily. Centrum   Yes Historical Provider, MD  NEOMYCIN-POLYMYXIN-HYDROCORTISONE (CORTISPORIN)  1 % SOLN otic solution Place 2 drops into both ears every 6 (six) hours. 06/27/14  Yes Birdie Riddle, MD  potassium chloride (K-DUR) 10 MEQ tablet Take 1 tablet (10 mEq total) by mouth daily. 06/27/14  Yes Birdie Riddle, MD  cephALEXin (KEFLEX) 500 MG capsule Take 1 capsule (500 mg total) by mouth 4 (four) times daily. 07/02/14   Dot Lanes, MD  oxyCODONE-acetaminophen (PERCOCET/ROXICET) 5-325 MG per tablet Take 2 tablets by mouth every 4 (four) hours as needed for severe pain. Patient taking differently: Take 1 tablet by mouth every 4 (four) hours as needed for severe pain.  06/07/14   Kalman Drape, MD   BP 114/65 mmHg  Pulse 90  Temp(Src) 98.8 F (37.1 C) (Oral)  Resp 20  Ht 5\' 4"  (1.626 m)  Wt  336 lb 3.2 oz (152.5 kg)  BMI 57.68 kg/m2  SpO2 99%  LMP 04/07/2014 Physical Exam  Constitutional: She is oriented to person, place, and time. She appears well-developed and well-nourished. No distress.  HENT:  Head: Normocephalic and atraumatic.  Eyes: Pupils are equal, round, and reactive to light.  Neck: Normal range of motion.  Cardiovascular: Normal rate and intact distal pulses.   Pulmonary/Chest: No respiratory distress. She has no wheezes. She has no rales.  Abdominal: Normal appearance. She exhibits no distension. There is tenderness (R groin and RLQ). There is no rebound.  Musculoskeletal: Normal range of motion.  Neurological: She is alert and oriented to person, place, and time. No cranial nerve deficit.  Skin: Skin is warm and dry. No rash noted.  Psychiatric: She has a normal mood and affect. Her behavior is normal.  Nursing note and vitals reviewed.   ED Course  Procedures (including critical care time) Labs Review Labs Reviewed  URINALYSIS, ROUTINE W REFLEX MICROSCOPIC - Abnormal; Notable for the following:    Color, Urine AMBER (*)    Hgb urine dipstick MODERATE (*)    All other components within normal limits  BASIC METABOLIC PANEL - Abnormal; Notable for the following:    Sodium 132 (*)    Glucose, Bld 110 (*)    GFR calc non Af Amer 78 (*)    All other components within normal limits  CBC WITH DIFFERENTIAL - Abnormal; Notable for the following:    WBC 15.7 (*)    RBC 3.84 (*)    Hemoglobin 10.8 (*)    HCT 33.4 (*)    Neutro Abs 11.2 (*)    Monocytes Absolute 1.3 (*)    All other components within normal limits  CBC - Abnormal; Notable for the following:    WBC 13.0 (*)    RBC 3.64 (*)    Hemoglobin 10.5 (*)    HCT 32.5 (*)    All other components within normal limits  BASIC METABOLIC PANEL - Abnormal; Notable for the following:    Sodium 133 (*)    Glucose, Bld 143 (*)    GFR calc non Af Amer 74 (*)    GFR calc Af Amer 86 (*)    All other  components within normal limits  CBC WITH DIFFERENTIAL - Abnormal; Notable for the following:    WBC 10.8 (*)    RBC 3.58 (*)    Hemoglobin 10.1 (*)    HCT 31.3 (*)    All other components within normal limits  BASIC METABOLIC PANEL - Abnormal; Notable for the following:    Sodium 134 (*)    Glucose, Bld 125 (*)  All other components within normal limits  CBC WITH DIFFERENTIAL - Abnormal; Notable for the following:    RBC 3.59 (*)    Hemoglobin 10.1 (*)    HCT 31.5 (*)    All other components within normal limits  URINE CULTURE  URINE MICROSCOPIC-ADD ON  LIPASE, BLOOD  POC URINE PREG, ED    Imaging Review   IMPRESSION: Small RIGHT retroperitoneal hematoma extending through the pelvis, given history of cardiac catheterization this is concerning for vascular injury. Consider CT angiography for further characterization.  Normal visualized appendix. No urolithiasis nor obstructive uropathy.  Acute findings discussed with and reconfirmed by Dr.Dian Laprade on 07/02/2014 at 11:45 pm.   Electronically Signed By: Elon Alas On: 07/02/2014 23:47   Discussed with Janalyn Rouse who who will come to emergency department to admit the patient MDM   Final diagnoses:  Flank pain        Dot Lanes, MD 07/06/14 2303

## 2014-07-02 NOTE — Discharge Instructions (Signed)

## 2014-07-02 NOTE — ED Notes (Signed)
Discussed with Dr. Audie Pinto plan of care and reveiwed results, WBC of 15.7. MD acknowledges.

## 2014-07-02 NOTE — ED Notes (Signed)
Dr. Audie Pinto at the bedside.

## 2014-07-02 NOTE — ED Notes (Signed)
Patient returned from CT

## 2014-07-02 NOTE — ED Notes (Signed)
Patient transported to CT 

## 2014-07-03 ENCOUNTER — Encounter (HOSPITAL_COMMUNITY): Payer: Self-pay | Admitting: *Deleted

## 2014-07-03 DIAGNOSIS — M170 Bilateral primary osteoarthritis of knee: Secondary | ICD-10-CM | POA: Diagnosis present

## 2014-07-03 DIAGNOSIS — L03314 Cellulitis of groin: Secondary | ICD-10-CM | POA: Diagnosis present

## 2014-07-03 DIAGNOSIS — G8929 Other chronic pain: Secondary | ICD-10-CM | POA: Diagnosis present

## 2014-07-03 DIAGNOSIS — D500 Iron deficiency anemia secondary to blood loss (chronic): Secondary | ICD-10-CM | POA: Diagnosis present

## 2014-07-03 DIAGNOSIS — K661 Hemoperitoneum: Secondary | ICD-10-CM | POA: Diagnosis present

## 2014-07-03 DIAGNOSIS — M542 Cervicalgia: Secondary | ICD-10-CM | POA: Diagnosis present

## 2014-07-03 DIAGNOSIS — E119 Type 2 diabetes mellitus without complications: Secondary | ICD-10-CM | POA: Diagnosis present

## 2014-07-03 DIAGNOSIS — M549 Dorsalgia, unspecified: Secondary | ICD-10-CM | POA: Diagnosis present

## 2014-07-03 DIAGNOSIS — Z885 Allergy status to narcotic agent status: Secondary | ICD-10-CM | POA: Diagnosis not present

## 2014-07-03 DIAGNOSIS — E039 Hypothyroidism, unspecified: Secondary | ICD-10-CM | POA: Diagnosis present

## 2014-07-03 DIAGNOSIS — Z880 Allergy status to penicillin: Secondary | ICD-10-CM | POA: Diagnosis not present

## 2014-07-03 DIAGNOSIS — Z6841 Body Mass Index (BMI) 40.0 and over, adult: Secondary | ICD-10-CM | POA: Diagnosis not present

## 2014-07-03 DIAGNOSIS — Z9889 Other specified postprocedural states: Secondary | ICD-10-CM

## 2014-07-03 DIAGNOSIS — G4733 Obstructive sleep apnea (adult) (pediatric): Secondary | ICD-10-CM | POA: Diagnosis present

## 2014-07-03 DIAGNOSIS — Z888 Allergy status to other drugs, medicaments and biological substances status: Secondary | ICD-10-CM | POA: Diagnosis not present

## 2014-07-03 DIAGNOSIS — M797 Fibromyalgia: Secondary | ICD-10-CM | POA: Diagnosis present

## 2014-07-03 DIAGNOSIS — J45909 Unspecified asthma, uncomplicated: Secondary | ICD-10-CM | POA: Diagnosis present

## 2014-07-03 DIAGNOSIS — R109 Unspecified abdominal pain: Secondary | ICD-10-CM | POA: Diagnosis present

## 2014-07-03 DIAGNOSIS — Z8673 Personal history of transient ischemic attack (TIA), and cerebral infarction without residual deficits: Secondary | ICD-10-CM | POA: Diagnosis not present

## 2014-07-03 DIAGNOSIS — G43909 Migraine, unspecified, not intractable, without status migrainosus: Secondary | ICD-10-CM | POA: Diagnosis present

## 2014-07-03 LAB — CBC
HEMATOCRIT: 32.5 % — AB (ref 36.0–46.0)
HEMOGLOBIN: 10.5 g/dL — AB (ref 12.0–15.0)
MCH: 28.8 pg (ref 26.0–34.0)
MCHC: 32.3 g/dL (ref 30.0–36.0)
MCV: 89.3 fL (ref 78.0–100.0)
Platelets: 285 10*3/uL (ref 150–400)
RBC: 3.64 MIL/uL — AB (ref 3.87–5.11)
RDW: 13.9 % (ref 11.5–15.5)
WBC: 13 10*3/uL — ABNORMAL HIGH (ref 4.0–10.5)

## 2014-07-03 LAB — BASIC METABOLIC PANEL
Anion gap: 8 (ref 5–15)
BUN: 9 mg/dL (ref 6–23)
CO2: 23 mmol/L (ref 19–32)
CREATININE: 0.92 mg/dL (ref 0.50–1.10)
Calcium: 8.6 mg/dL (ref 8.4–10.5)
Chloride: 102 mEq/L (ref 96–112)
GFR calc non Af Amer: 74 mL/min — ABNORMAL LOW (ref >90)
GFR, EST AFRICAN AMERICAN: 86 mL/min — AB (ref >90)
GLUCOSE: 143 mg/dL — AB (ref 70–99)
Potassium: 3.9 mmol/L (ref 3.5–5.1)
Sodium: 133 mmol/L — ABNORMAL LOW (ref 135–145)

## 2014-07-03 MED ORDER — GABAPENTIN 300 MG PO CAPS
300.0000 mg | ORAL_CAPSULE | Freq: Two times a day (BID) | ORAL | Status: DC
  Administered 2014-07-03 – 2014-07-05 (×6): 300 mg via ORAL
  Filled 2014-07-03 (×7): qty 1

## 2014-07-03 MED ORDER — AMITRIPTYLINE HCL 10 MG PO TABS
10.0000 mg | ORAL_TABLET | Freq: Every evening | ORAL | Status: DC | PRN
  Administered 2014-07-03 – 2014-07-05 (×3): 10 mg via ORAL
  Filled 2014-07-03 (×10): qty 1

## 2014-07-03 MED ORDER — SODIUM CHLORIDE 0.9 % IJ SOLN: 3.0000 mL | INTRAMUSCULAR | Status: DC | PRN

## 2014-07-03 MED ORDER — INFLUENZA VAC SPLIT QUAD 0.5 ML IM SUSY
0.5000 mL | PREFILLED_SYRINGE | INTRAMUSCULAR | Status: DC
  Filled 2014-07-03: qty 0.5

## 2014-07-03 MED ORDER — VANCOMYCIN HCL 10 G IV SOLR
1250.0000 mg | Freq: Three times a day (TID) | INTRAVENOUS | Status: DC
  Administered 2014-07-03: 1250 mg via INTRAVENOUS
  Filled 2014-07-03 (×3): qty 1250

## 2014-07-03 MED ORDER — VITAMIN D3 25 MCG (1000 UNIT) PO TABS
1000.0000 [IU] | ORAL_TABLET | Freq: Every day | ORAL | Status: DC
  Administered 2014-07-03 – 2014-07-05 (×3): 1000 [IU] via ORAL
  Filled 2014-07-03 (×3): qty 1

## 2014-07-03 MED ORDER — ONDANSETRON HCL 4 MG/2ML IJ SOLN: 4.0000 mg | Freq: Four times a day (QID) | INTRAMUSCULAR | Status: DC | PRN

## 2014-07-03 MED ORDER — NEOMYCIN-POLYMYXIN-HC 1 % OT SOLN
2.0000 [drp] | Freq: Four times a day (QID) | OTIC | Status: DC
  Administered 2014-07-03 – 2014-07-05 (×8): 2 [drp] via OTIC
  Filled 2014-07-03: qty 10

## 2014-07-03 MED ORDER — CYCLOBENZAPRINE HCL 10 MG PO TABS
10.0000 mg | ORAL_TABLET | Freq: Every day | ORAL | Status: DC
Start: 2014-07-03 — End: 2014-07-05
  Administered 2014-07-03 – 2014-07-04 (×3): 10 mg via ORAL
  Filled 2014-07-03 (×4): qty 1

## 2014-07-03 MED ORDER — DOCUSATE SODIUM 100 MG PO CAPS
100.0000 mg | ORAL_CAPSULE | Freq: Two times a day (BID) | ORAL | Status: DC
  Administered 2014-07-03 – 2014-07-05 (×5): 100 mg via ORAL
  Filled 2014-07-03 (×6): qty 1

## 2014-07-03 MED ORDER — DEXTROSE 5 % IV SOLN
2.0000 g | Freq: Three times a day (TID) | INTRAVENOUS | Status: DC
  Administered 2014-07-03 – 2014-07-05 (×7): 2 g via INTRAVENOUS
  Filled 2014-07-03 (×12): qty 2

## 2014-07-03 MED ORDER — ALBUTEROL SULFATE (2.5 MG/3ML) 0.083% IN NEBU: 2.5000 mg | INHALATION_SOLUTION | Freq: Four times a day (QID) | RESPIRATORY_TRACT | Status: DC | PRN

## 2014-07-03 MED ORDER — VANCOMYCIN HCL 10 G IV SOLR
1250.0000 mg | Freq: Two times a day (BID) | INTRAVENOUS | Status: DC
  Administered 2014-07-03 – 2014-07-05 (×4): 1250 mg via INTRAVENOUS
  Filled 2014-07-03 (×5): qty 1250

## 2014-07-03 MED ORDER — VITAMIN C 500 MG PO TABS
500.0000 mg | ORAL_TABLET | Freq: Every day | ORAL | Status: DC
  Administered 2014-07-03 – 2014-07-05 (×3): 500 mg via ORAL
  Filled 2014-07-03 (×3): qty 1

## 2014-07-03 MED ORDER — METFORMIN HCL 500 MG PO TABS
250.0000 mg | ORAL_TABLET | Freq: Two times a day (BID) | ORAL | Status: DC
  Administered 2014-07-04: 250 mg via ORAL
  Filled 2014-07-03 (×7): qty 1

## 2014-07-03 MED ORDER — SODIUM CHLORIDE 0.9 % IJ SOLN
3.0000 mL | Freq: Two times a day (BID) | INTRAMUSCULAR | Status: DC
  Administered 2014-07-03 – 2014-07-04 (×4): 3 mL via INTRAVENOUS

## 2014-07-03 MED ORDER — SENNA 8.6 MG PO TABS
1.0000 | ORAL_TABLET | Freq: Two times a day (BID) | ORAL | Status: DC | PRN
  Administered 2014-07-03: 8.6 mg via ORAL
  Filled 2014-07-03 (×2): qty 1

## 2014-07-03 MED ORDER — ADULT MULTIVITAMIN W/MINERALS CH
1.0000 | ORAL_TABLET | Freq: Every day | ORAL | Status: DC
  Administered 2014-07-03 – 2014-07-05 (×3): 1 via ORAL
  Filled 2014-07-03 (×3): qty 1

## 2014-07-03 MED ORDER — SODIUM CHLORIDE 0.9 % IV SOLN: 250.0000 mL | INTRAVENOUS | Status: DC | PRN

## 2014-07-03 MED ORDER — ONDANSETRON HCL 4 MG PO TABS
4.0000 mg | ORAL_TABLET | Freq: Four times a day (QID) | ORAL | Status: DC | PRN
Start: 2014-07-03 — End: 2014-07-05

## 2014-07-03 MED ORDER — FERROUS SULFATE 325 (65 FE) MG PO TABS
325.0000 mg | ORAL_TABLET | Freq: Every day | ORAL | Status: DC
  Administered 2014-07-03 – 2014-07-05 (×3): 325 mg via ORAL
  Filled 2014-07-03 (×4): qty 1

## 2014-07-03 MED ORDER — ALUM & MAG HYDROXIDE-SIMETH 200-200-20 MG/5ML PO SUSP
30.0000 mL | Freq: Four times a day (QID) | ORAL | Status: DC | PRN
  Administered 2014-07-04: 30 mL via ORAL
  Filled 2014-07-03: qty 30

## 2014-07-03 MED ORDER — VANCOMYCIN HCL 10 G IV SOLR
2000.0000 mg | Freq: Once | INTRAVENOUS | Status: AC
  Administered 2014-07-03: 2000 mg via INTRAVENOUS
  Filled 2014-07-03: qty 2000

## 2014-07-03 MED ORDER — OXYCODONE-ACETAMINOPHEN 5-325 MG PO TABS
1.0000 | ORAL_TABLET | ORAL | Status: DC | PRN
  Administered 2014-07-03 – 2014-07-05 (×9): 1 via ORAL
  Filled 2014-07-03 (×10): qty 1

## 2014-07-03 MED ORDER — ALBUTEROL SULFATE HFA 108 (90 BASE) MCG/ACT IN AERS: 1.0000 | INHALATION_SPRAY | Freq: Four times a day (QID) | RESPIRATORY_TRACT | Status: DC | PRN

## 2014-07-03 MED ORDER — POTASSIUM CHLORIDE ER 10 MEQ PO TBCR
10.0000 meq | EXTENDED_RELEASE_TABLET | Freq: Every day | ORAL | Status: DC
  Administered 2014-07-03 – 2014-07-05 (×3): 10 meq via ORAL
  Filled 2014-07-03 (×3): qty 1

## 2014-07-03 MED ORDER — SODIUM CHLORIDE 0.9 % IJ SOLN
3.0000 mL | Freq: Two times a day (BID) | INTRAMUSCULAR | Status: DC
  Administered 2014-07-05: 3 mL via INTRAVENOUS

## 2014-07-03 MED ORDER — DOCUSATE SODIUM 100 MG PO CAPS
100.0000 mg | ORAL_CAPSULE | Freq: Two times a day (BID) | ORAL | Status: DC | PRN
  Administered 2014-07-03: 100 mg via ORAL
  Filled 2014-07-03: qty 1

## 2014-07-03 NOTE — Progress Notes (Addendum)
ANTIBIOTIC CONSULT NOTE - INITIAL  Pharmacy Consult for Vancomycin/Cefepime  Indication: Possible wound infection  Allergies  Allergen Reactions  . Ibuprofen Anaphylaxis and Swelling  . Diazepam Other (See Comments)    Hallucinations   . Hydrocodone Other (See Comments)    hallucinations  . Penicillins Itching  . Contrast Media [Iodinated Diagnostic Agents] Itching and Rash    Patient Measurements: Height: 5\' 4"  (162.6 cm) Weight: (!) 337 lb (152.862 kg) IBW/kg (Calculated) : 54.7  Vital Signs: Temp: 99.1 F (37.3 C) (12/26 2023) Temp Source: Oral (12/26 2023) BP: 120/72 mmHg (12/27 0045) Pulse Rate: 106 (12/27 0045)  Labs:  Recent Labs  07/02/14 2107  WBC 15.7*  HGB 10.8*  PLT 302  CREATININE 0.88   Estimated Creatinine Clearance: 119.8 mL/min (by C-G formula based on Cr of 0.88).  Medical History: Past Medical History  Diagnosis Date  . Hypothyroidism   . Obesity   . Overactive bladder   . Nerve damage in leg  . Pneumonia 2012; 2013; 2014    w/bronchitis  . Chronic bronchitis "q yr"  . OSA on CPAP   . Borderline type 2 diabetes mellitus   . Anemia   . Migraine     "probably q other week" (06/24/2014)  . TIA (transient ischemic attack) 11/2013  . Arthritis     "knees" (06/24/2014)  . Asthma      Assessment: 43 y/o F with groin pain s/p cardiac cath, WBC elevated, Tmax 99.1, renal function good, other labs as above.   Goal of Therapy:  Vancomycin trough level 15-20 mcg/ml  Plan:  -Vancomycin 2000 mg IV x 1, then given 1250 mg IV q8h -Cefepime 2g IV q8h -Trend WBC, temp, renal function  -Drug levels ASAP with obesity  Narda Bonds 07/03/2014,1:32 AM  ---------------------------------------------------------------------------------------------------- Addendum:  Given the patient's normalized CrCl~85-90 ml/min - will reduce Vancomycin dose slightly.  Plan 1. Reduce Vancomycin to 1250 mg IV every 12 hours 2. Continue Cefepime 2g IV  every 8 hours 3. Will continue to follow renal function, culture results, LOT, and antibiotic de-escalation plans   Alycia Rossetti, PharmD, BCPS Clinical Pharmacist Pager: [phone] 07/03/2014 3:45 PM

## 2014-07-03 NOTE — H&P (Signed)
Referring Physician:  [name] is an 43 y.o. female.                       Chief Complaint: Right groin pain  HPI: 43 year old female with a history of obesity and diabetes presents to ER with a chief complaint of right groin pain increased with motion starting 5 days after cardiac catheterization. Also has elevated WBC count and mild fever. CT of abdomen and pelvis is positive for small hematoma in right retro-peritonium. No visible or palpable bleed in right groin cath site.   Past Medical History  Diagnosis Date  . Hypothyroidism   . Obesity   . Overactive bladder   . Nerve damage in leg  . Pneumonia 2012; 2013; 2014    w/bronchitis  . Chronic bronchitis "q yr"  . OSA on CPAP   . Borderline type 2 diabetes mellitus   . Anemia   . Migraine     "probably q other week" (06/24/2014)  . TIA (transient ischemic attack) 11/2013  . Arthritis     "knees" (06/24/2014)  . Asthma       Past Surgical History  Procedure Laterality Date  . Cesarean section  2000  . Left heart catheterization with coronary angiogram N/A 06/27/2014    Procedure: LEFT HEART CATHETERIZATION WITH CORONARY ANGIOGRAM;  Surgeon: Birdie Riddle, MD;  Location: [facility];  Service: Cardiovascular;  Laterality: N/A;    Family History  Problem Relation Age of Onset  . Ovarian cancer Mother     Ovarian Cancer  . Prostate cancer Father     Prostate Cancer  . Heart disease Other     Grandparent  . Diabetes Other     Parent   Social History:  reports that she has never smoked. She has never used smokeless tobacco. She reports that she does not drink alcohol or use illicit drugs.  Allergies:  Allergies  Allergen Reactions  . Ibuprofen Anaphylaxis and Swelling  . Diazepam Other (See Comments)    Hallucinations   . Hydrocodone Other (See Comments)    hallucinations  . Penicillins Itching  . Contrast Media [Iodinated Diagnostic Agents] Itching and Rash     (Not in a hospital admission)  Results  for orders placed or performed during the hospital encounter of 07/02/14 (from the past 48 hour(s))  Urinalysis, Routine w reflex microscopic     Status: Abnormal   Collection Time: 07/02/14  7:26 PM  Result Value Ref Range   Color, Urine AMBER (A) YELLOW    Comment: BIOCHEMICALS MAY BE AFFECTED BY COLOR   APPearance CLEAR CLEAR   Specific Gravity, Urine 1.024 1.005 - 1.030   pH 5.5 5.0 - 8.0   Glucose, UA NEGATIVE NEGATIVE mg/dL   Hgb urine dipstick MODERATE (A) NEGATIVE   Bilirubin Urine NEGATIVE NEGATIVE   Ketones, ur NEGATIVE NEGATIVE mg/dL   Protein, ur NEGATIVE NEGATIVE mg/dL   Urobilinogen, UA 1.0 0.0 - 1.0 mg/dL   Nitrite NEGATIVE NEGATIVE   Leukocytes, UA NEGATIVE NEGATIVE  Urine microscopic-add on     Status: None   Collection Time: 07/02/14  7:26 PM  Result Value Ref Range   Squamous Epithelial / LPF RARE RARE   WBC, UA 0-2 <3 WBC/hpf   RBC / HPF 3-6 <3 RBC/hpf   Bacteria, UA RARE RARE  POC Urine Pregnancy, ED  (If Pre-menopausal female) - do not order at [facility]     Status: None  Collection Time: 07/02/14  7:38 PM  Result Value Ref Range   Preg Test, Ur NEGATIVE NEGATIVE    Comment:        THE SENSITIVITY OF THIS METHODOLOGY IS >24 mIU/mL   Basic metabolic panel     Status: Abnormal   Collection Time: 07/02/14  9:07 PM  Result Value Ref Range   Sodium 132 (L) 135 - 145 mmol/L    Comment: Please note change in reference range.   Potassium 3.7 3.5 - 5.1 mmol/L    Comment: Please note change in reference range.   Chloride 100 96 - 112 mEq/L   CO2 24 19 - 32 mmol/L   Glucose, Bld 110 (H) 70 - 99 mg/dL   BUN 8 6 - 23 mg/dL   Creatinine, Ser 0.88 0.50 - 1.10 mg/dL   Calcium 8.9 8.4 - 10.5 mg/dL   GFR calc non Af Amer 78 (L) >90 mL/min   GFR calc Af Amer >90 >90 mL/min    Comment: (NOTE) The eGFR has been calculated using the CKD EPI equation. This calculation has not been validated in all clinical situations. eGFR's persistently <90 mL/min signify possible Chronic  Kidney Disease.    Anion gap 8 5 - 15  CBC with Differential     Status: Abnormal   Collection Time: 07/02/14  9:07 PM  Result Value Ref Range   WBC 15.7 (H) 4.0 - 10.5 K/uL   RBC 3.84 (L) 3.87 - 5.11 MIL/uL   Hemoglobin 10.8 (L) 12.0 - 15.0 g/dL   HCT 33.4 (L) 36.0 - 46.0 %   MCV 87.0 78.0 - 100.0 fL   MCH 28.1 26.0 - 34.0 pg   MCHC 32.3 30.0 - 36.0 g/dL   RDW 13.9 11.5 - 15.5 %   Platelets 302 150 - 400 K/uL   Neutrophils Relative % 72 43 - 77 %   Neutro Abs 11.2 (H) 1.7 - 7.7 K/uL   Lymphocytes Relative 20 12 - 46 %   Lymphs Abs 3.2 0.7 - 4.0 K/uL   Monocytes Relative 8 3 - 12 %   Monocytes Absolute 1.3 (H) 0.1 - 1.0 K/uL   Eosinophils Relative 0 0 - 5 %   Eosinophils Absolute 0.0 0.0 - 0.7 K/uL   Basophils Relative 0 0 - 1 %   Basophils Absolute 0.0 0.0 - 0.1 K/uL  Lipase, blood     Status: None   Collection Time: 07/02/14  9:07 PM  Result Value Ref Range   Lipase 23 11 - 59 U/L   Ct Abdomen Pelvis Wo Contrast  07/02/2014   CLINICAL DATA:  RIGHT flank and RIGHT groin pain, microscopic hematuria. Recent cardiac catheterization.  EXAM: CT ABDOMEN AND PELVIS WITHOUT CONTRAST  TECHNIQUE: Multidetector CT imaging of the abdomen and pelvis was performed following the standard protocol without IV contrast.  COMPARISON:  Acute abdominal series Nov 08, 2013 and CT of the abdomen and pelvis April 25, 2011  FINDINGS: Large body habitus results in overall noisy image quality.  LUNG BASES: Included view of the lung bases are clear. The visualized heart and pericardium are unremarkable.  KIDNEYS/BLADDER: Kidneys are orthotopic, demonstrating normal size and morphology ; scarring RIGHT lower pole. No nephrolithiasis, hydronephrosis; limited assessment for renal masses on this nonenhanced examination. The unopacified ureters are normal in course and caliber. Urinary bladder is partially distended and unremarkable.  SOLID ORGANS: The liver, spleen, pancreas and adrenal glands are unremarkable  for this non-contrast examination. Tiny gallstone without CT findings  of acute cholecystitis.  GASTROINTESTINAL TRACT: The stomach, small and large bowel are normal in course and caliber without inflammatory changes, the sensitivity may be decreased by lack of enteric contrast. Normal visualized proximal appendix.  PERITONEUM/RETROPERITONEUM: Inflammatory changes RIGHT lower quadrant, small RIGHT retroperitoneal small hematoma, flank stranding. Small amount of free fluid in the pelvis could be physiologic. Aortoiliac vessels are normal in course and caliber. No lymphadenopathy by CT size criteria. Internal reproductive organs are unremarkable.  SOFT TISSUES/ OSSEOUS STRUCTURES: Nonsuspicious. Mild L5-S1 degenerative disc. Small fat containing umbilical hernia.  IMPRESSION: Small RIGHT retroperitoneal hematoma extending through the pelvis, given history of cardiac catheterization this is concerning for vascular injury. Consider CT angiography for further characterization.  Normal visualized appendix. No urolithiasis nor obstructive uropathy.  Acute findings discussed with and reconfirmed by Dr.ROBERT BEATON on 07/02/2014 at 11:45 pm.   Electronically Signed   By: Elon Alas   On: 07/02/2014 23:47    Review Of Systems Constitutional: Negative for fever and chills.  Eyes: Negative for double vision and photophobia.  Respiratory: Negative for cough, hemoptysis and sputum production.  Cardiovascular: Positive for chest pain and leg swelling. Negative for orthopnea and claudication.  Gastrointestinal: Negative for nausea, vomiting and abdominal pain.  Genitourinary: Negative for dysuria.  Neurological: Positive for dizziness and headaches  Blood pressure 110/64, pulse 105, temperature 99.1 F (37.3 C), temperature source Oral, resp. rate 19, height $RemoveBe'5\' 4"'kCDVRGCSj$  (1.626 m), weight 152.862 kg (337 lb), last menstrual period 04/07/2014, SpO2 100 %.  Physical Exam: Constitutional: She appears  well-developed and over-nourished. No distress. Morbidly obese  HENT: Normocephalic and atraumatic. Brown eyes, Conjunctivae and EOM are normal. Pupils are equal, round, and reactive to light.  Neck: Normal range of motion. Neck supple. Normal carotid pulses and no JVD present. Carotid bruit is not present. No rigidity. Normal range of motion present.  Cardiovascular: Normal rate, regular rhythm, S1 normal, S2 normal, normal heart sounds, intact distal pulses and normal pulses. Exam reveals no gallop and no friction rub.No murmur heard. Pulmonary/Chest: Effort normal and breath sounds normal. No accessory muscle usage or stridor. No respiratory distress. She exhibits left parasternal tenderness.  Abdominal: Bowel sounds are normal. Soft non tender. Non pulsatile aorta.RLQ tenderness. Ext: Right groin tenderness. No visible hematoma or ecchymosis or dischgage.  Skin: Skin is warm, dry and intact. No rash noted. She is not diaphoretic. No cyanosis. Nails show no clubbing.  Assessment/Plan Right sided retroperitoneal hematoma Chest pain at rest Obesity, morbid H/O hyperglycemia Chronic back pain Chronic neck pain  Place in observation.  Monitor hemoglobin. IV antibiotic.  Birdie Riddle, MD  07/03/2014, 12:38 AM

## 2014-07-03 NOTE — ED Notes (Signed)
Meal provided as requested.

## 2014-07-03 NOTE — Progress Notes (Signed)
UR Completed.  [phone]  

## 2014-07-03 NOTE — Progress Notes (Signed)
VASCULAR LAB PRELIMINARY  PRELIMINARY  PRELIMINARY  PRELIMINARY  Right groin ultrasound to rule out psuedoaneurysm completed.    Preliminary report:  There is no obvious evidence of pseudoaneurysm or AV fistula noted in the right groin.  Kathryne Ramella, RVT 07/03/2014, 11:53 AM

## 2014-07-04 ENCOUNTER — Inpatient Hospital Stay (HOSPITAL_COMMUNITY): Payer: Medicaid Other

## 2014-07-04 ENCOUNTER — Encounter: Payer: Self-pay | Admitting: *Deleted

## 2014-07-04 LAB — CBC WITH DIFFERENTIAL/PLATELET
BASOS ABS: 0 10*3/uL (ref 0.0–0.1)
Basophils Relative: 0 % (ref 0–1)
EOS ABS: 0.2 10*3/uL (ref 0.0–0.7)
EOS PCT: 2 % (ref 0–5)
HCT: 31.3 % — ABNORMAL LOW (ref 36.0–46.0)
Hemoglobin: 10.1 g/dL — ABNORMAL LOW (ref 12.0–15.0)
Lymphocytes Relative: 24 % (ref 12–46)
Lymphs Abs: 2.6 10*3/uL (ref 0.7–4.0)
MCH: 28.2 pg (ref 26.0–34.0)
MCHC: 32.3 g/dL (ref 30.0–36.0)
MCV: 87.4 fL (ref 78.0–100.0)
Monocytes Absolute: 0.8 10*3/uL (ref 0.1–1.0)
Monocytes Relative: 7 % (ref 3–12)
NEUTROS PCT: 67 % (ref 43–77)
Neutro Abs: 7.2 10*3/uL (ref 1.7–7.7)
PLATELETS: 302 10*3/uL (ref 150–400)
RBC: 3.58 MIL/uL — AB (ref 3.87–5.11)
RDW: 13.7 % (ref 11.5–15.5)
WBC: 10.8 10*3/uL — AB (ref 4.0–10.5)

## 2014-07-04 LAB — URINE CULTURE: Colony Count: 25000

## 2014-07-04 MED ORDER — FUROSEMIDE 10 MG/ML IJ SOLN: 40.0000 mg | Freq: Once | INTRAMUSCULAR | Status: DC

## 2014-07-04 NOTE — Progress Notes (Signed)
Ref: Birdie Riddle, MD   Subjective:  Feeling better. No chest pain. Abdominal discomfort continues. T max 100 degree F. Ultrasound negative for pseudoaneurysm.  Objective:  Vital Signs in the last 24 hours: Temp:  [98.7 F (37.1 C)-100 F (37.8 C)] 98.7 F (37.1 C) (12/28 0639) Pulse Rate:  [93-109] 93 (12/28 0639) Cardiac Rhythm:  [-] Normal sinus rhythm (12/28 0855) Resp:  [18-20] 18 (12/28 0639) BP: (101-115)/(55-68) 101/68 mmHg (12/28 0639) SpO2:  [94 %-100 %] 100 % (12/28 1914)  Physical Exam: BP Readings from Last 1 Encounters:  07/04/14 101/68    Wt Readings from Last 1 Encounters:  07/03/14 152.5 kg (336 lb 3.2 oz)    Weight change:   HEENT: [place]/AT, Eyes-Brown, PERL, EOMI, Conjunctiva-Pale pink, Sclera-Non-icteric Neck: No JVD, No bruit, Trachea midline. Lungs:  Clear, Bilateral. Cardiac:  Regular rhythm, normal S1 and S2, no S3.  Abdomen:  Soft, RLQ-tender. Extremities:  No edema present. No cyanosis. No clubbing. No right groin hematoma/ecchymosis CNS: AxOx3, Cranial nerves grossly intact, moves all 4 extremities. Right handed. Skin: Warm and dry.   Intake/Output from previous day: 12/27 0701 - 12/28 0700 In: 480 [P.O.:480] Out: 1750 [Urine:1750]    Lab Results: BMET    Component Value Date/Time   NA 133* 07/03/2014 0610   NA 132* 07/02/2014 2107   NA 137 06/26/2014 1708   K 3.9 07/03/2014 0610   K 3.7 07/02/2014 2107   K 4.3 06/26/2014 1708   CL 102 07/03/2014 0610   CL 100 07/02/2014 2107   CL 102 06/26/2014 1708   CO2 23 07/03/2014 0610   CO2 24 07/02/2014 2107   CO2 25 06/26/2014 1708   GLUCOSE 143* 07/03/2014 0610   GLUCOSE 110* 07/02/2014 2107   GLUCOSE 88 06/26/2014 1708   BUN 9 07/03/2014 0610   BUN 8 07/02/2014 2107   BUN 10 06/26/2014 1708   CREATININE 0.92 07/03/2014 0610   CREATININE 0.88 07/02/2014 2107   CREATININE 0.85 06/26/2014 1708   CALCIUM 8.6 07/03/2014 0610   CALCIUM 8.9 07/02/2014 2107   CALCIUM 9.5 06/26/2014 1708    GFRNONAA 74* 07/03/2014 0610   GFRNONAA 78* 07/02/2014 2107   GFRNONAA 82* 06/26/2014 1708   GFRAA 86* 07/03/2014 0610   GFRAA >90 07/02/2014 2107   GFRAA >90 06/26/2014 1708   CBC    Component Value Date/Time   WBC 13.0* 07/03/2014 0610   RBC 3.64* 07/03/2014 0610   HGB 10.5* 07/03/2014 0610   HCT 32.5* 07/03/2014 0610   PLT 285 07/03/2014 0610   MCV 89.3 07/03/2014 0610   MCH 28.8 07/03/2014 0610   MCHC 32.3 07/03/2014 0610   RDW 13.9 07/03/2014 0610   LYMPHSABS 3.2 07/02/2014 2107   MONOABS 1.3* 07/02/2014 2107   EOSABS 0.0 07/02/2014 2107   BASOSABS 0.0 07/02/2014 2107   HEPATIC Function Panel No results for input(s): PROT in the last 8760 hours.  Invalid input(s):  ALBUMIN,  AST,  ALT,  ALKPHOS,  BILIDIR,  IBILI HEMOGLOBIN A1C No components found for: HGA1C,  MPG CARDIAC ENZYMES Lab Results  Component Value Date   TROPONINI <0.30 06/25/2014   TROPONINI <0.30 06/24/2014   TROPONINI <0.30 06/24/2014   BNP No results for input(s): PROBNP in the last 8760 hours. TSH  Recent Labs  08/23/13 1530  TSH 2.896   CHOLESTEROL  Recent Labs  06/25/14 0305  CHOL 138    Scheduled Meds: . ceFEPime (MAXIPIME) IV  2 g Intravenous 3 times per day  . cholecalciferol  1,000 Units Oral Daily  . cyclobenzaprine  10 mg Oral QHS  . docusate sodium  100 mg Oral BID  . ferrous sulfate  325 mg Oral Q breakfast  . gabapentin  300 mg Oral BID  . Influenza vac split quadrivalent PF  0.5 mL Intramuscular Tomorrow-1000  . metFORMIN  250 mg Oral BID WC  . multivitamin with minerals  1 tablet Oral Daily  . NEOMYCIN-POLYMYXIN-HYDROCORTISONE  2 drop Both Ears 4 times per day  . potassium chloride  10 mEq Oral Daily  . sodium chloride  3 mL Intravenous Q12H  . sodium chloride  3 mL Intravenous Q12H  . vancomycin  1,250 mg Intravenous Q12H  . vitamin C  500 mg Oral Daily   Continuous Infusions:  PRN Meds:.sodium chloride, albuterol, alum & mag hydroxide-simeth, amitriptyline,  docusate sodium, ondansetron **OR** ondansetron (ZOFRAN) IV, oxyCODONE-acetaminophen, senna, sodium chloride  Assessment/Plan: Right sided retroperitoneal hematoma Chest pain at rest Obesity, morbid H/O hyperglycemia Chronic back pain Chronic neck pain  Awaiting blood work report. Increase activity as tolerated.     LOS: 2 days    Dixie Dials  MD  07/04/2014, 1:24 PM

## 2014-07-05 ENCOUNTER — Encounter: Payer: Self-pay | Admitting: Obstetrics & Gynecology

## 2014-07-05 LAB — CBC WITH DIFFERENTIAL/PLATELET
BASOS ABS: 0 10*3/uL (ref 0.0–0.1)
Basophils Relative: 0 % (ref 0–1)
EOS PCT: 2 % (ref 0–5)
Eosinophils Absolute: 0.2 10*3/uL (ref 0.0–0.7)
HCT: 31.5 % — ABNORMAL LOW (ref 36.0–46.0)
Hemoglobin: 10.1 g/dL — ABNORMAL LOW (ref 12.0–15.0)
Lymphocytes Relative: 26 % (ref 12–46)
Lymphs Abs: 2.2 10*3/uL (ref 0.7–4.0)
MCH: 28.1 pg (ref 26.0–34.0)
MCHC: 32.1 g/dL (ref 30.0–36.0)
MCV: 87.7 fL (ref 78.0–100.0)
Monocytes Absolute: 0.7 10*3/uL (ref 0.1–1.0)
Monocytes Relative: 8 % (ref 3–12)
NEUTROS ABS: 5.3 10*3/uL (ref 1.7–7.7)
NEUTROS PCT: 64 % (ref 43–77)
Platelets: 305 10*3/uL (ref 150–400)
RBC: 3.59 MIL/uL — ABNORMAL LOW (ref 3.87–5.11)
RDW: 13.7 % (ref 11.5–15.5)
WBC: 8.4 10*3/uL (ref 4.0–10.5)

## 2014-07-05 LAB — BASIC METABOLIC PANEL
ANION GAP: 11 (ref 5–15)
BUN: 10 mg/dL (ref 6–23)
CALCIUM: 8.9 mg/dL (ref 8.4–10.5)
CO2: 21 mmol/L (ref 19–32)
Chloride: 102 mEq/L (ref 96–112)
Creatinine, Ser: 0.77 mg/dL (ref 0.50–1.10)
Glucose, Bld: 125 mg/dL — ABNORMAL HIGH (ref 70–99)
POTASSIUM: 3.8 mmol/L (ref 3.5–5.1)
SODIUM: 134 mmol/L — AB (ref 135–145)

## 2014-07-05 NOTE — Discharge Summary (Signed)
Physician Discharge Summary  Patient ID: Peggy Avery MRN: [id number] DOB/AGE: April 01, 1971 43 y.o.  Admit date: 07/02/2014 Discharge date: 07/05/2014  Admission Diagnoses: Right sided retroperitoneal hematoma Chest pain at rest Obesity, morbid H/O hyperglycemia Chronic back pain Chronic neck pain  Discharge Diagnoses:  Principle Problem: * Retroperitoneal hematoma * Possible right groin early cellulitis Chest pain at rest Obesity, morbid H/O hyperglycemia Chronic back pain Chronic neck pain Anemia of blood loss Migraine headache Fibromyalgia  Discharged Condition: fair  Hospital Course: 43 year old female with a history of obesity and diabetes presents to ER with a chief complaint of right groin pain increased with motion starting 5 days after cardiac catheterization. Also had elevated WBC count and mild fever. CT of abdomen and pelvis was positive for small hematoma in right retro-peritonium. No visible or palpable bleed/ecchymosis in right groin cath site. Ultrasound of right upper leg was negative for pseudoaneurysm and repeat CT of pelvis in 2 days showed stable hematoma size and Hgb (10.1) did not drop significantly over 72 hours as well. Her elevated WBC count normalized, she remained afebrile for 24 hours and was started on oral antibiotics. She will be followed by me in 1 month and was advised to live sedentary lifestyle for additional 1-2 weeks till abdominal discomfort improved over time. Her episode of migraine headache was treated with analgesic and amitriptyline.  Consults: cardiology  Significant Diagnostic Studies: labs: Elevated WBC count of 15.7 on admission and 8.4 on day of discharge. Near normal BMET with mild hyperglycemia.  CT abdomen/Pelvis: 1. Stable right retroperitoneal hematoma extending into the extraperitoneal space of the right pelvis. 2. No new findings compared to study of 07/02/2014.  Treatments: antibiotics: Vancomycin and  Cefipime.  Discharge Exam: Blood pressure 107/63, pulse 95, temperature 98.4 F (36.9 C), temperature source Oral, resp. rate 18, height 5\' 4"  (1.626 m), weight 152.5 kg (336 lb 3.2 oz), last menstrual period 04/07/2014, SpO2 95 %. HEENT: [place]/AT, Eyes-Brown, PERL, EOMI, Conjunctiva-Pale pink, Sclera-Non-icteric Neck: No JVD, No bruit, Trachea midline. Lungs: Clear, Bilateral. Cardiac: Regular rhythm, normal S1 and S2, no S3.  Abdomen: Soft, RLQ-tender. Extremities: No edema present. No cyanosis. No clubbing. No right groin hematoma/ecchymosis/discharge. CNS: AxOx3, Cranial nerves grossly intact, moves all 4 extremities. Right handed. Skin: Warm and dry.  Disposition: 01-Home or Self Care     Medication List    STOP taking these medications        aspirin EC 81 MG tablet      TAKE these medications        acetaminophen 500 MG tablet  Commonly known as:  TYLENOL  Take 1,000 mg by mouth every 6 (six) hours as needed (menstrual cramps).     albuterol 108 (90 BASE) MCG/ACT inhaler  Commonly known as:  PROVENTIL HFA;VENTOLIN HFA  Inhale 1-2 puffs into the lungs every 6 (six) hours as needed for wheezing or shortness of breath.     amitriptyline 10 MG tablet  Commonly known as:  ELAVIL  Take 10 mg by mouth at bedtime as needed for sleep (migraines).     cephALEXin 500 MG capsule  Commonly known as:  KEFLEX  Take 1 capsule (500 mg total) by mouth 4 (four) times daily.     cholecalciferol 1000 UNITS tablet  Commonly known as:  VITAMIN D  Take 1,000 Units by mouth daily.     cyclobenzaprine 10 MG tablet  Commonly known as:  FLEXERIL  Take 10 mg by mouth at bedtime.  ferrous sulfate 325 (65 FE) MG tablet  Take 325 mg by mouth daily with breakfast.     gabapentin 300 MG capsule  Commonly known as:  NEURONTIN  Take 300 mg by mouth 2 (two) times daily.     multivitamin with minerals Tabs tablet  Take 1 tablet by mouth daily. Centrum      NEOMYCIN-POLYMYXIN-HYDROCORTISONE 1 % Soln otic solution  Commonly known as:  CORTISPORIN  Place 2 drops into both ears every 6 (six) hours.     oxyCODONE-acetaminophen 5-325 MG per tablet  Commonly known as:  PERCOCET/ROXICET  Take 2 tablets by mouth every 4 (four) hours as needed for severe pain.     potassium chloride 10 MEQ tablet  Commonly known as:  K-DUR  Take 1 tablet (10 mEq total) by mouth daily.     VITAMIN C PO  Take 2 tablets by mouth daily.           Follow-up Information    Follow up with [facility], MD. Schedule an appointment as soon as possible for a visit in 1 month.   Specialty:  Cardiology   Contact information:   [place] [address] [phone]       Signed: Birdie Riddle 07/05/2014, 8:30 AM

## 2014-07-05 NOTE — Progress Notes (Signed)
Pt A&O x4; pt discharge education and instructions completed with pt and daughter at bedside. All voices understanding and denies any questions. Pt IV and telemetry removed; pt given her prescription for Keflex and percocet by MD; pt discharge home with daughter to transport her home. Pt transported off unit via wheelchair with belongings to the side. Francis Gaines Jenise Iannelli RN.

## 2014-08-09 ENCOUNTER — Ambulatory Visit: Payer: Medicaid Other | Admitting: Obstetrics

## 2015-01-02 ENCOUNTER — Other Ambulatory Visit: Payer: Self-pay

## 2015-01-03 IMAGING — CT CT ABD-PELV W/O CM
2 of 4 series · 10 of 46 positions shown, 11 images · non-contrast
Comparison: Acute abdominal series November 08, 2013 and CT of the
abdomen and pelvis April 25, 2011

CLINICAL DATA: RIGHT flank and RIGHT groin pain, microscopic
hematuria. Recent cardiac catheterization.

EXAM:
CT ABDOMEN AND PELVIS WITHOUT CONTRAST
TECHNIQUE: Multidetector CT imaging of the abdomen and pelvis was performed
following the standard protocol without IV contrast.

[Series 201: stone study, idose (2) · axial · 0.98mm/px · z∈[-703,-283]mm · 7 of 101 slices shown, 8 images]
[im 9/101  soft-tissue]
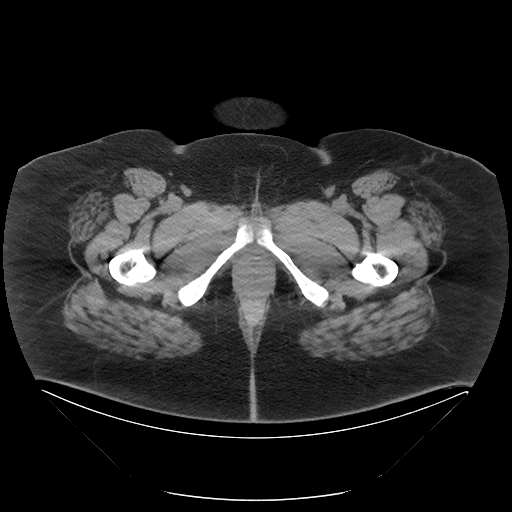
[im 9/101  bone]
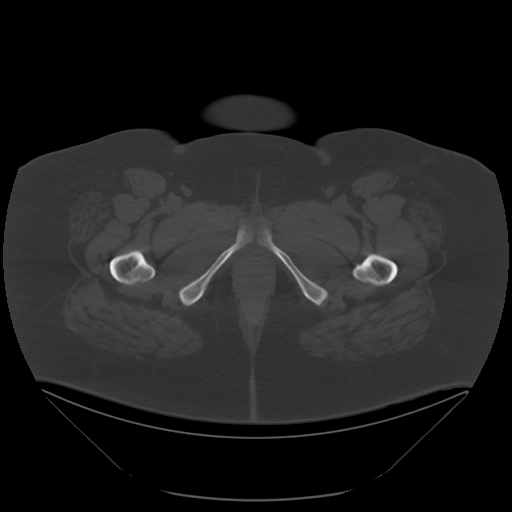
[im 25/101  soft-tissue]
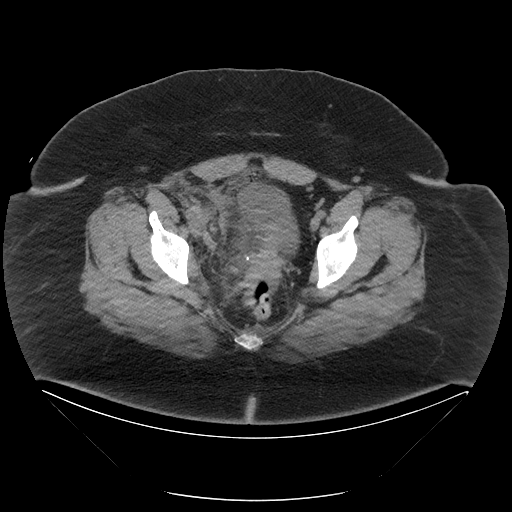
[im 37/101  soft-tissue]
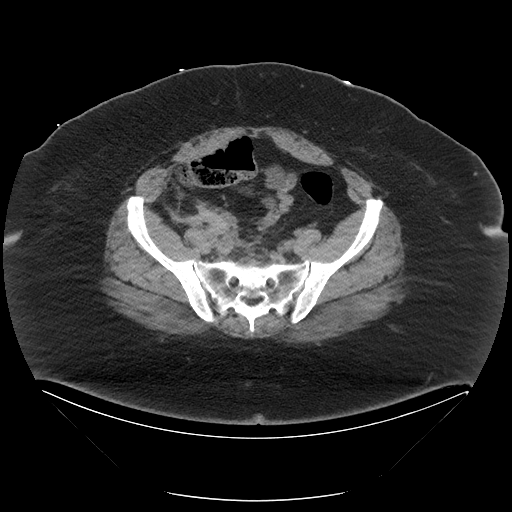
[im 53/101  soft-tissue]
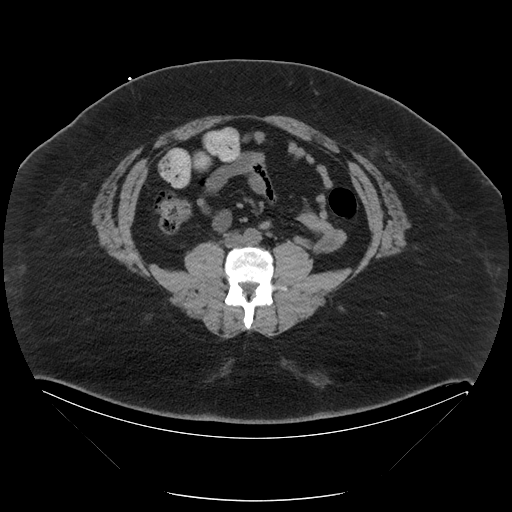
[im 65/101  soft-tissue]
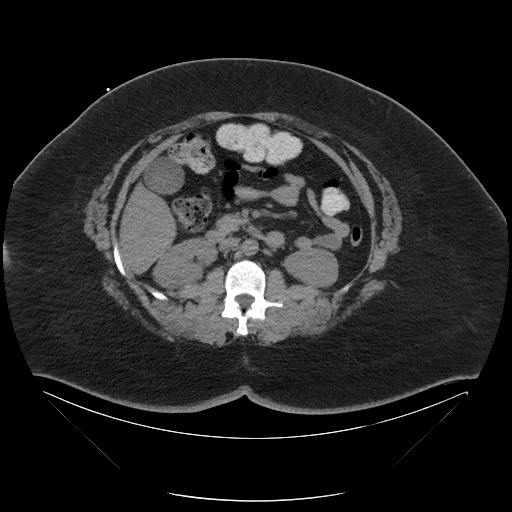
[im 77/101  soft-tissue]
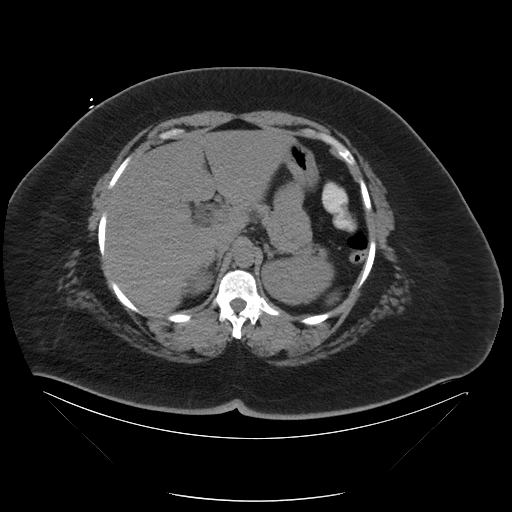
[im 93/101  soft-tissue]
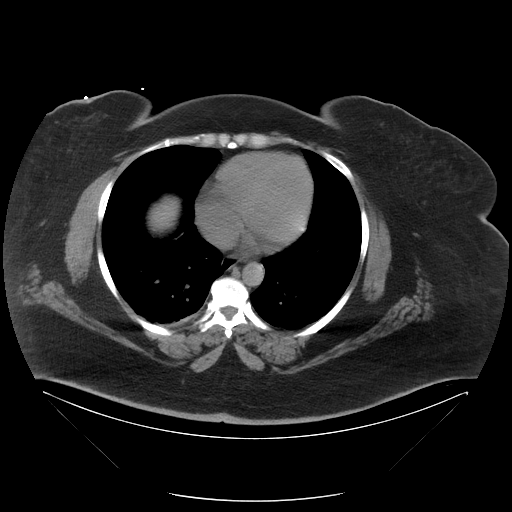

[Series 202: coronals, idose (2) · coronal · 0.50mm/px · 3 of 191 slices shown]
[im 64/191  soft-tissue]
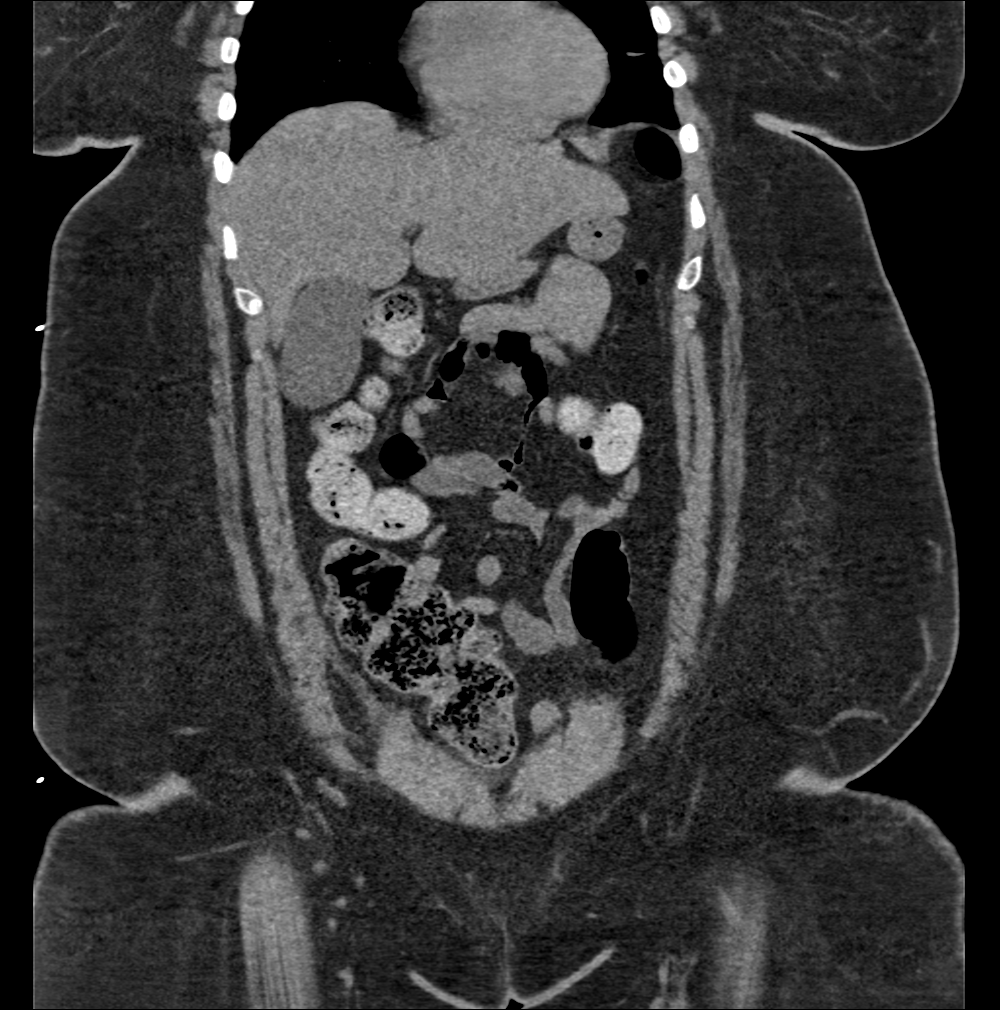
[im 85/191  soft-tissue]
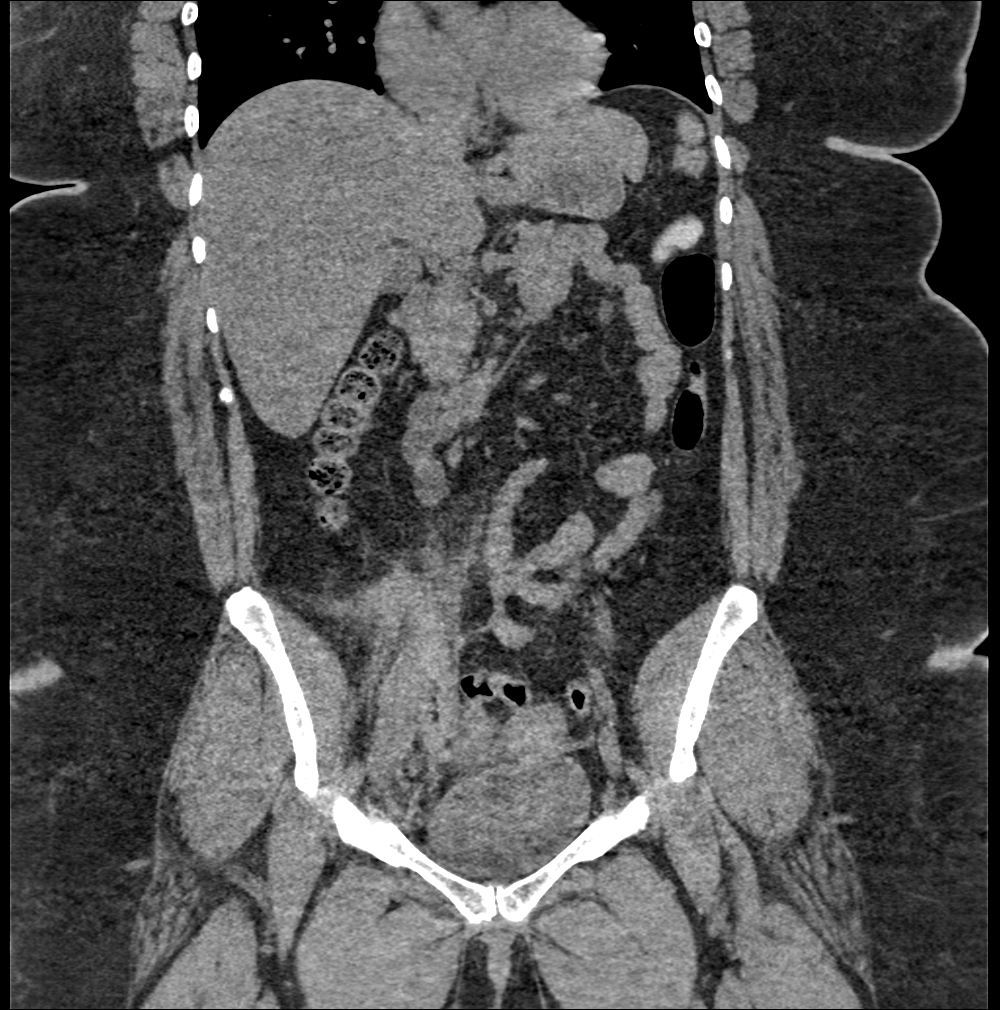
[im 106/191  soft-tissue]
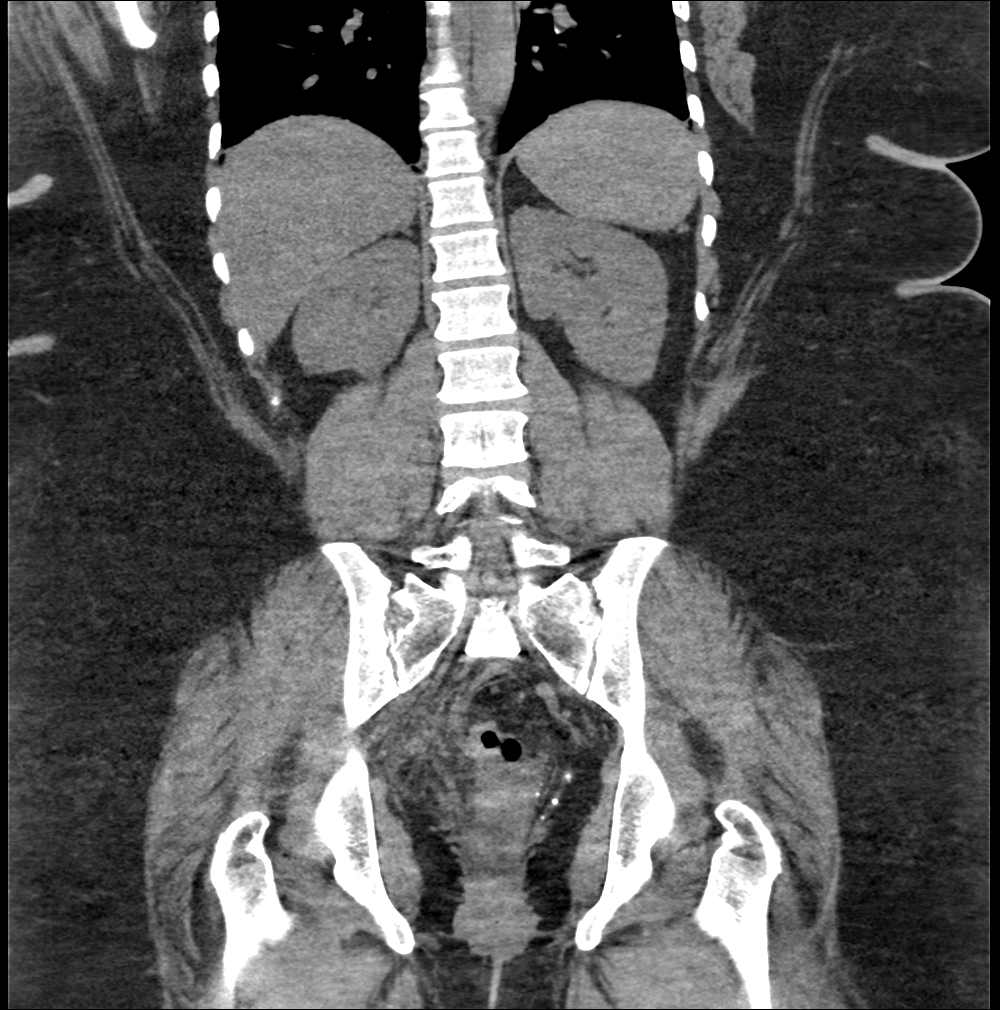

[10 of 46 positions shown; findings below may reference images not displayed]

FINDINGS: Large body habitus results in overall noisy image quality.

LUNG BASES: Included view of the lung bases are clear. The
visualized heart and pericardium are unremarkable.

KIDNEYS/BLADDER: Kidneys are orthotopic, demonstrating normal size
and morphology ; scarring RIGHT lower pole. No nephrolithiasis,
hydronephrosis; limited assessment for renal masses on this
nonenhanced examination. The unopacified ureters are normal in
course and caliber. Urinary bladder is partially distended and
unremarkable.

SOLID ORGANS: The liver, spleen, pancreas and adrenal glands are
unremarkable for this non-contrast examination. Tiny gallstone
without CT findings of acute cholecystitis.

GASTROINTESTINAL TRACT: The stomach, small and large bowel are
normal in course and caliber without inflammatory changes, the
sensitivity may be decreased by lack of enteric contrast. Normal
visualized proximal appendix.

PERITONEUM/RETROPERITONEUM: Inflammatory changes RIGHT lower
quadrant, small RIGHT retroperitoneal small hematoma, flank
stranding. Small amount of free fluid in the pelvis could be
physiologic. Aortoiliac vessels are normal in course and caliber. No
lymphadenopathy by CT size criteria. Internal reproductive organs
are unremarkable.

SOFT TISSUES/ OSSEOUS STRUCTURES: Nonsuspicious. Mild L5-S1
degenerative disc. Small fat containing umbilical hernia.
IMPRESSION: Small RIGHT retroperitoneal hematoma extending through the pelvis,
given history of cardiac catheterization this is concerning for
vascular injury. Consider CT angiography for further
characterization.

Normal visualized appendix. No urolithiasis nor obstructive
uropathy.

Acute findings discussed with and reconfirmed by Dr.CALIN GB on

  By: Felix Osvaldo Calvio

## 2015-03-14 ENCOUNTER — Other Ambulatory Visit: Payer: Self-pay

## 2015-03-14 DIAGNOSIS — Z1231 Encounter for screening mammogram for malignant neoplasm of breast: Secondary | ICD-10-CM

## 2015-03-23 ENCOUNTER — Ambulatory Visit
Admission: RE | Admit: 2015-03-23 | Discharge: 2015-03-23 | Disposition: A | Discharge status: Home / Self Care | Payer: Medicaid Other | Source: Ambulatory Visit | Attending: Obstetrics | Admitting: Obstetrics

## 2015-03-23 DIAGNOSIS — Z1231 Encounter for screening mammogram for malignant neoplasm of breast: Secondary | ICD-10-CM

## 2015-04-06 ENCOUNTER — Ambulatory Visit: Payer: Medicaid Other | Admitting: Certified Nurse Midwife

## 2015-07-07 ENCOUNTER — Ambulatory Visit (INDEPENDENT_AMBULATORY_CARE_PROVIDER_SITE_OTHER): Payer: Medicaid Other | Admitting: Podiatry

## 2015-07-07 ENCOUNTER — Ambulatory Visit (INDEPENDENT_AMBULATORY_CARE_PROVIDER_SITE_OTHER): Payer: Medicaid Other

## 2015-07-07 ENCOUNTER — Encounter: Payer: Self-pay | Admitting: Podiatry

## 2015-07-07 VITALS — BP 132/78 | HR 78 | Resp 16

## 2015-07-07 DIAGNOSIS — M7661 Achilles tendinitis, right leg: Secondary | ICD-10-CM

## 2015-07-07 DIAGNOSIS — M79673 Pain in unspecified foot: Secondary | ICD-10-CM

## 2015-07-07 DIAGNOSIS — M7750 Other enthesopathy of unspecified foot: Secondary | ICD-10-CM

## 2015-07-07 NOTE — Progress Notes (Signed)
Subjective:     Patient ID: Peggy Avery, female   DOB: Feb 14, 1971, 44 y.o.   MRN: [id number]  HPI patient presents concerned because of pain in the back of the right heel cord and might of had an injury to this area   Review of Systems  All other systems reviewed and are negative.      Objective:   Physical Exam  Constitutional: She is oriented to person, place, and time.  Cardiovascular: Intact distal pulses.   Musculoskeletal: Normal range of motion.  Neurological: She is oriented to person, place, and time.  Skin: Skin is warm.  Nursing note and vitals reviewed.  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

## 2015-12-21 ENCOUNTER — Ambulatory Visit: Payer: Medicaid Other | Admitting: Obstetrics

## 2015-12-26 ENCOUNTER — Ambulatory Visit (INDEPENDENT_AMBULATORY_CARE_PROVIDER_SITE_OTHER): Payer: Medicaid Other | Admitting: Obstetrics

## 2015-12-26 ENCOUNTER — Encounter: Payer: Self-pay | Admitting: Obstetrics

## 2015-12-26 VITALS — BP 148/85 | HR 82 | Wt 351.0 lb

## 2015-12-26 NOTE — Progress Notes (Signed)
Appointment cancelled. Patient a former patient of Dr. Delsa Sale and would like to continue care with her.  She is moving to [place], [place].  A/P:  Oligomenorrhea.  Patient prefers a female physician provider and plans to continue care with Dr. Delsa Sale.          Records will be transferred per patient request.  Clenton Pare. Harper MD 6 / 20 / 2017

## 2016-07-31 ENCOUNTER — Other Ambulatory Visit: Payer: Self-pay | Admitting: Obstetrics

## 2016-07-31 DIAGNOSIS — Z1231 Encounter for screening mammogram for malignant neoplasm of breast: Secondary | ICD-10-CM

## 2016-08-01 ENCOUNTER — Ambulatory Visit
Admission: RE | Admit: 2016-08-01 | Discharge: 2016-08-01 | Disposition: A | Discharge status: Home / Self Care | Payer: Medicaid Other | Source: Ambulatory Visit | Attending: Obstetrics | Admitting: Obstetrics

## 2016-08-01 DIAGNOSIS — Z1231 Encounter for screening mammogram for malignant neoplasm of breast: Secondary | ICD-10-CM
